# Patient Record
Sex: Female | Born: 1975 | Race: White | Hispanic: No | Marital: Married | Smoking: Never smoker
Health system: Southern US, Community
[De-identification: ages and names within clinical notes are randomized; demographics above are authoritative.]

## PROBLEM LIST (undated history)

## (undated) ENCOUNTER — Inpatient Hospital Stay (HOSPITAL_COMMUNITY): Payer: Self-pay

## (undated) DIAGNOSIS — R87619 Unspecified abnormal cytological findings in specimens from cervix uteri: Secondary | ICD-10-CM

## (undated) DIAGNOSIS — F419 Anxiety disorder, unspecified: Secondary | ICD-10-CM

## (undated) DIAGNOSIS — F32A Depression, unspecified: Secondary | ICD-10-CM

## (undated) DIAGNOSIS — IMO0002 Reserved for concepts with insufficient information to code with codable children: Secondary | ICD-10-CM

## (undated) DIAGNOSIS — F329 Major depressive disorder, single episode, unspecified: Secondary | ICD-10-CM

## (undated) HISTORY — DX: Depression, unspecified: F32.A

## (undated) HISTORY — PX: BREAST LUMPECTOMY: SHX2

## (undated) HISTORY — DX: Major depressive disorder, single episode, unspecified: F32.9

## (undated) HISTORY — DX: Anxiety disorder, unspecified: F41.9

---

## 1998-01-28 ENCOUNTER — Other Ambulatory Visit: Admission: RE | Admit: 1998-01-28 | Discharge: 1998-01-28 | Payer: Self-pay | Admitting: Obstetrics and Gynecology

## 1998-02-28 ENCOUNTER — Other Ambulatory Visit: Admission: RE | Admit: 1998-02-28 | Discharge: 1998-02-28 | Payer: Self-pay | Admitting: *Deleted

## 1998-08-08 ENCOUNTER — Other Ambulatory Visit: Admission: RE | Admit: 1998-08-08 | Discharge: 1998-08-08 | Payer: Self-pay | Admitting: *Deleted

## 1998-12-05 ENCOUNTER — Other Ambulatory Visit: Admission: RE | Admit: 1998-12-05 | Discharge: 1998-12-05 | Payer: Self-pay | Admitting: Obstetrics and Gynecology

## 1999-03-06 ENCOUNTER — Other Ambulatory Visit: Admission: RE | Admit: 1999-03-06 | Discharge: 1999-03-06 | Payer: Self-pay | Admitting: *Deleted

## 1999-09-18 ENCOUNTER — Other Ambulatory Visit: Admission: RE | Admit: 1999-09-18 | Discharge: 1999-09-18 | Payer: Self-pay | Admitting: *Deleted

## 2001-05-18 ENCOUNTER — Other Ambulatory Visit: Admission: RE | Admit: 2001-05-18 | Discharge: 2001-05-18 | Payer: Self-pay | Admitting: Obstetrics and Gynecology

## 2001-05-23 ENCOUNTER — Encounter: Payer: Self-pay | Admitting: Obstetrics and Gynecology

## 2001-05-23 ENCOUNTER — Encounter: Admission: RE | Admit: 2001-05-23 | Discharge: 2001-05-23 | Payer: Self-pay | Admitting: *Deleted

## 2001-10-13 ENCOUNTER — Other Ambulatory Visit: Admission: RE | Admit: 2001-10-13 | Discharge: 2001-10-13 | Payer: Self-pay | Admitting: Obstetrics and Gynecology

## 2002-04-14 ENCOUNTER — Ambulatory Visit (HOSPITAL_COMMUNITY): Admission: RE | Admit: 2002-04-14 | Discharge: 2002-04-14 | Payer: Self-pay | Admitting: Orthopaedic Surgery

## 2002-04-14 ENCOUNTER — Encounter: Payer: Self-pay | Admitting: Orthopaedic Surgery

## 2002-06-13 ENCOUNTER — Other Ambulatory Visit: Admission: RE | Admit: 2002-06-13 | Discharge: 2002-06-13 | Payer: Self-pay | Admitting: Obstetrics and Gynecology

## 2008-10-17 ENCOUNTER — Emergency Department: Payer: Self-pay | Admitting: Emergency Medicine

## 2009-06-18 ENCOUNTER — Emergency Department: Payer: Self-pay | Admitting: Emergency Medicine

## 2010-05-06 ENCOUNTER — Ambulatory Visit: Payer: Self-pay | Admitting: Unknown Physician Specialty

## 2010-06-08 IMAGING — CR DG CHEST 1V PORT
1 series · 1 of 1 positions shown · non-contrast
Comparison: none

REASON FOR EXAM: pain in epigastrium
COMMENTS:

PROCEDURE:     DXR - DXR PORTABLE CHEST SINGLE VIEW  - October 17, 2008 [DATE]
RESULT:     AP view of the chest shows the lung fields to be clear. The
heart, mediastinal and osseous structures show no acute changes.

[view not recorded]
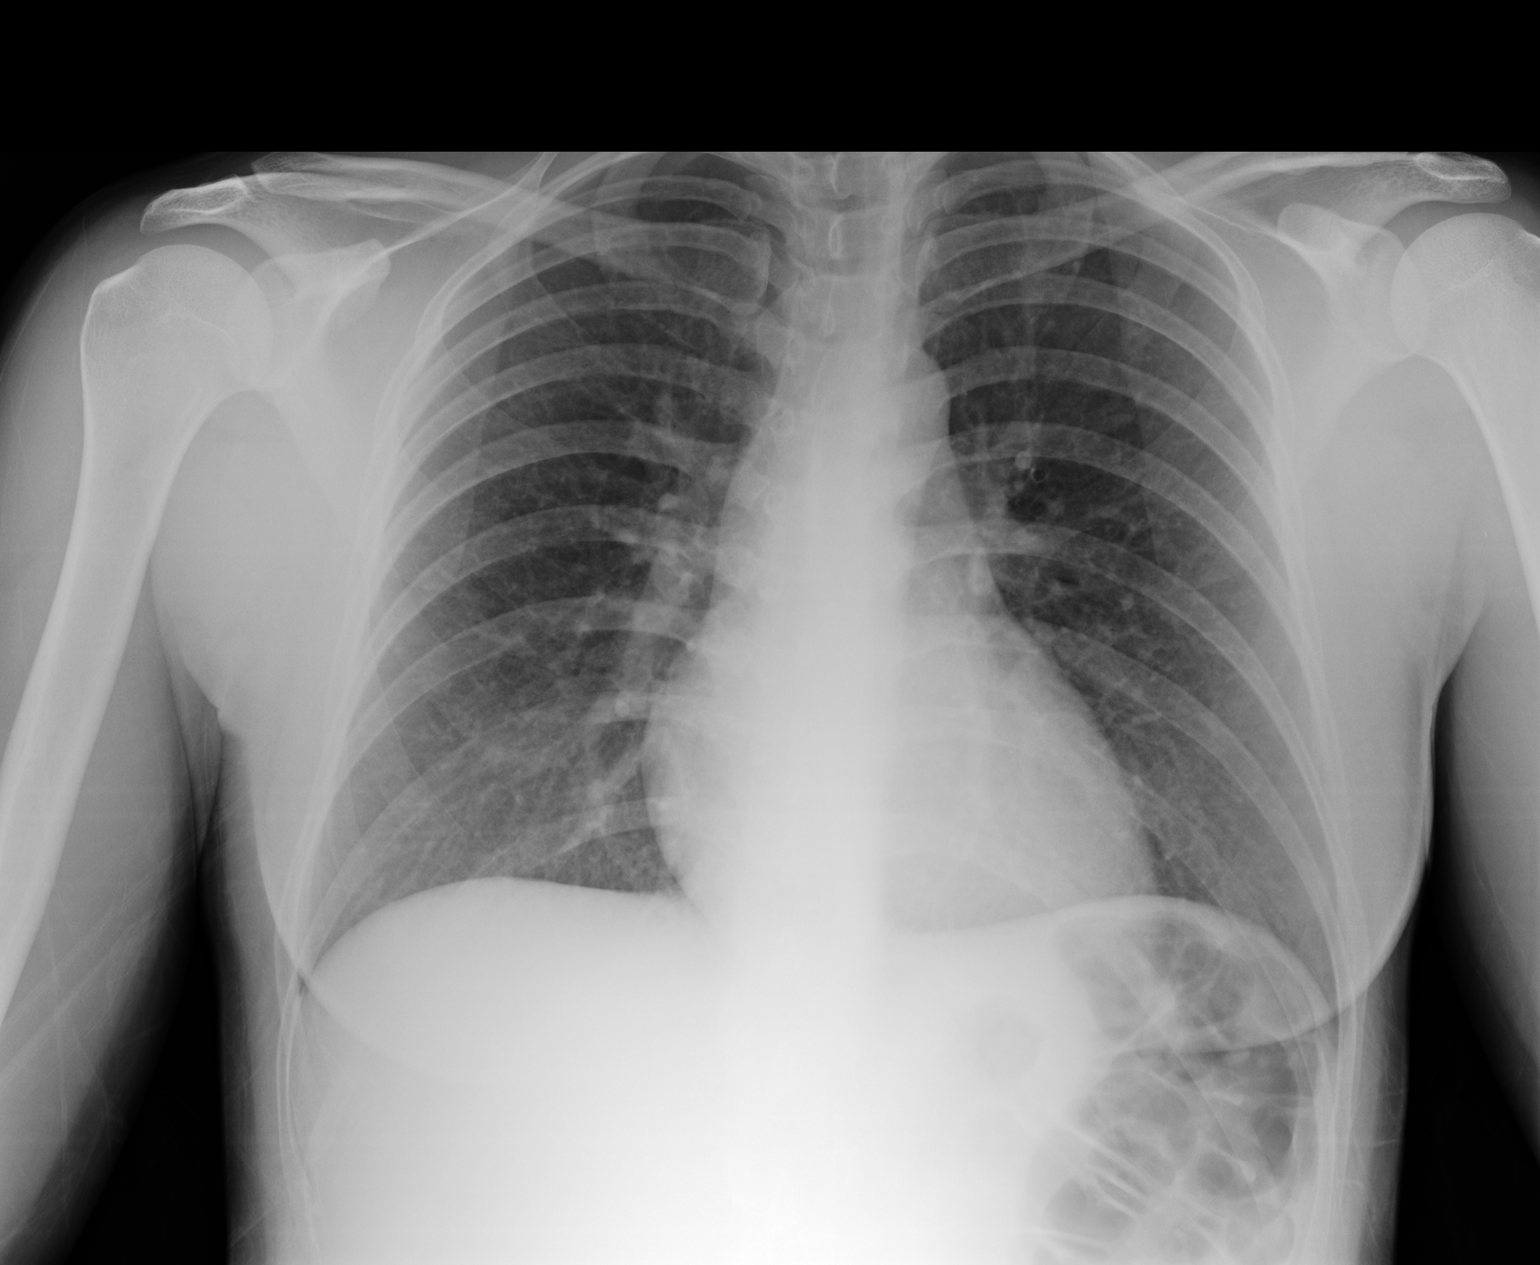

[1 of 1 positions shown; findings below may reference images not displayed]

IMPRESSION: 1.     No acute changes are identified.

## 2010-06-09 IMAGING — CT CT ABD-PELV W/ CM
1 of 2 series · 15 of 32 positions shown, 19 images · non-contrast
Comparison: none

REASON FOR EXAM: (1) abd pain; (2) abd pain
COMMENTS:

PROCEDURE:     CT  - CT ABDOMEN / PELVIS  W  - October 18, 2008  [DATE]
RESULT:
HISTORY: Nausea and epigastric pain.

[Series 2: appendicitis · axial · 0.66mm/px · z∈[+402,+798]mm · 15 of 144 slices shown, 19 images]
[im 6/144  soft-tissue]
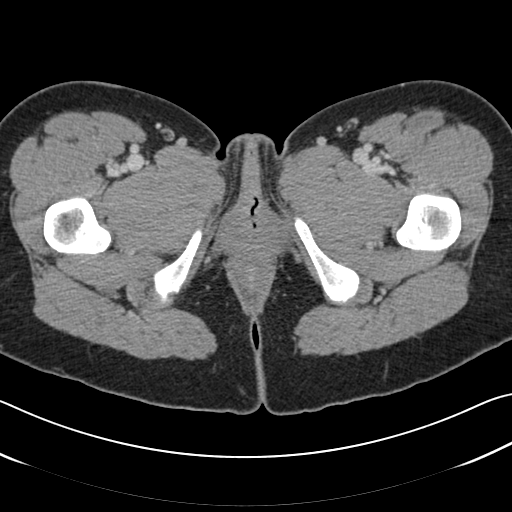
[im 6/144  bone]
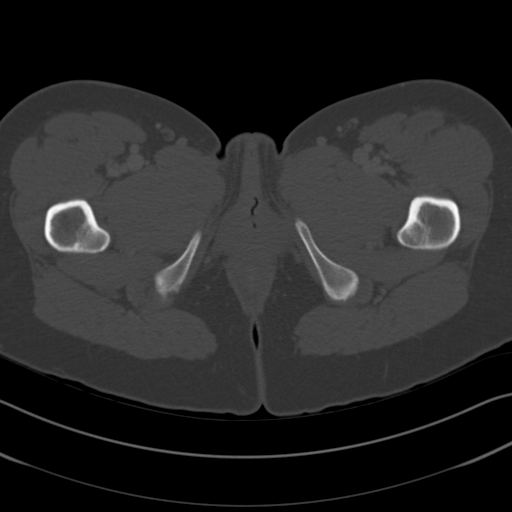
[im 18/144  soft-tissue]
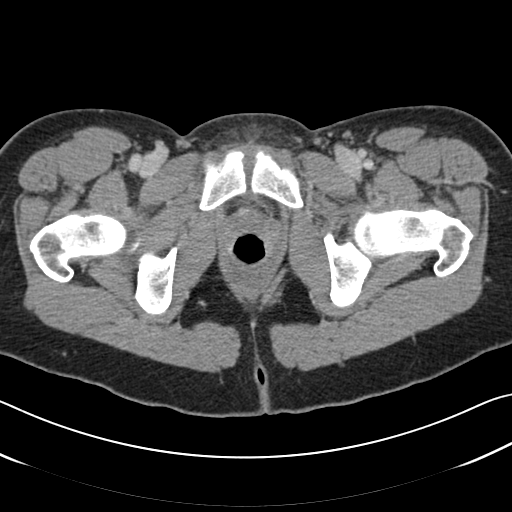
[im 30/144  soft-tissue]
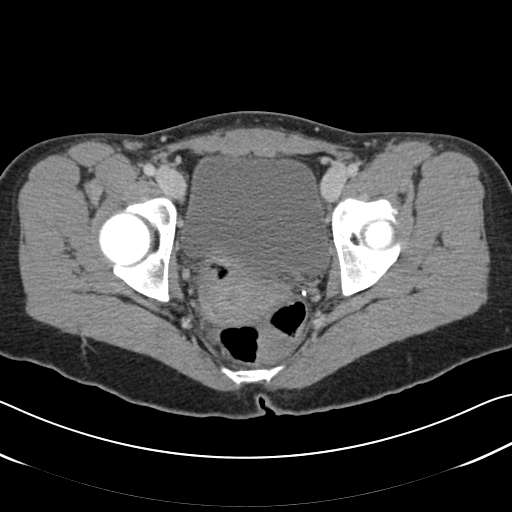
[im 42/144  soft-tissue]
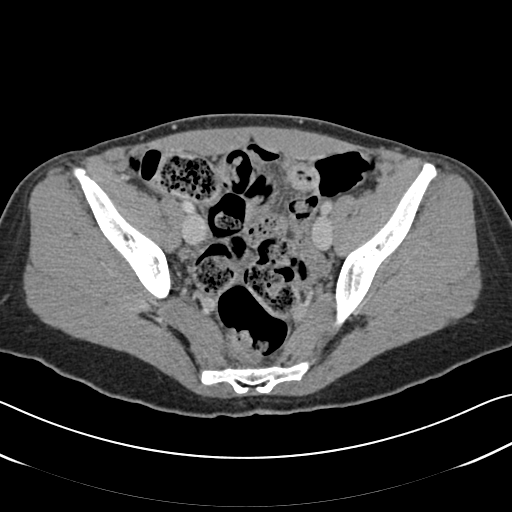
[im 48/144  soft-tissue]
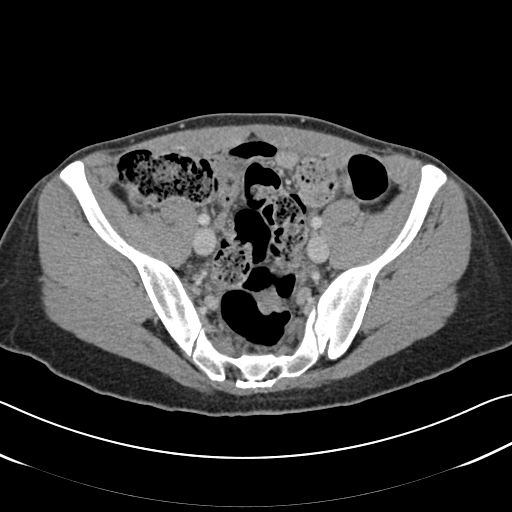
[im 60/144  soft-tissue]
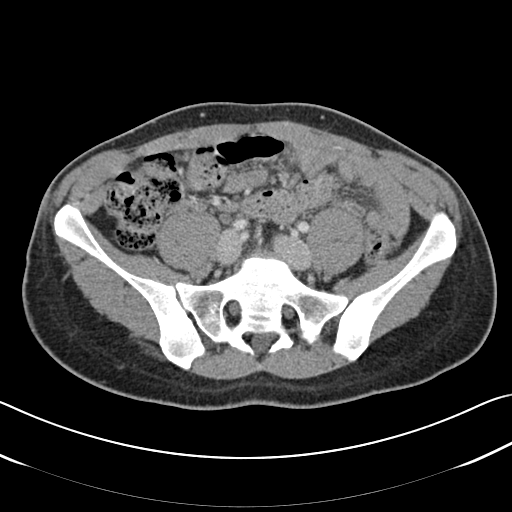
[im 72/144  soft-tissue]
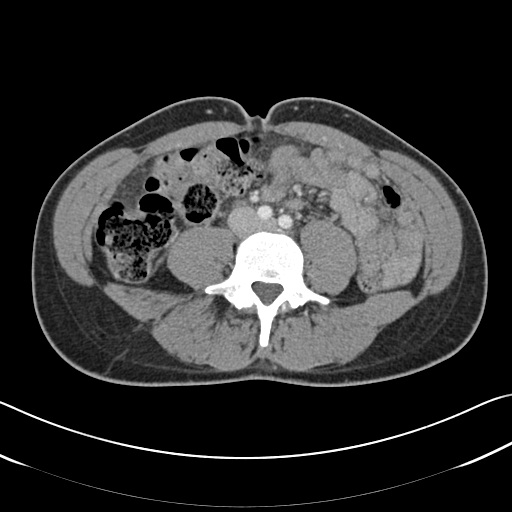
[im 84/144  soft-tissue]
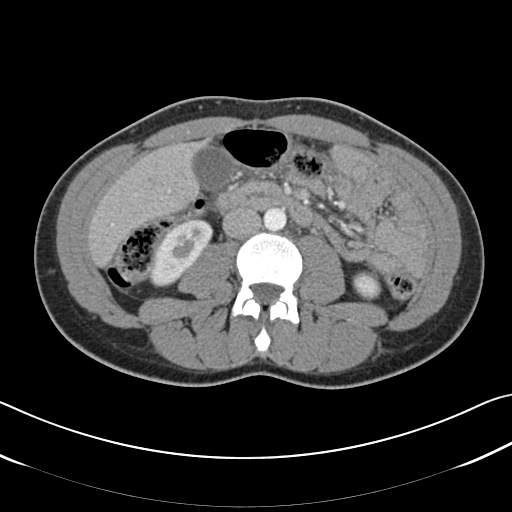
[im 96/144  soft-tissue]
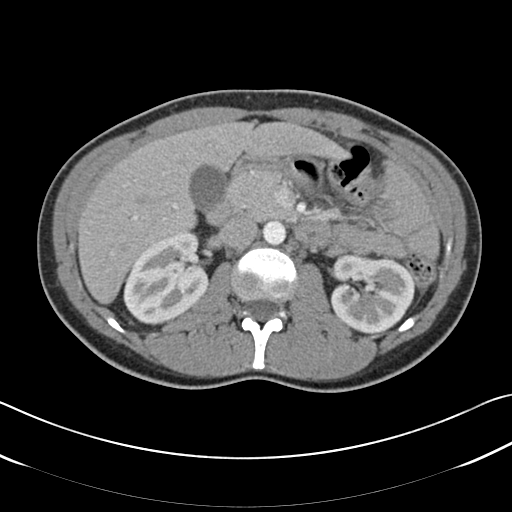
[im 96/144  bone]
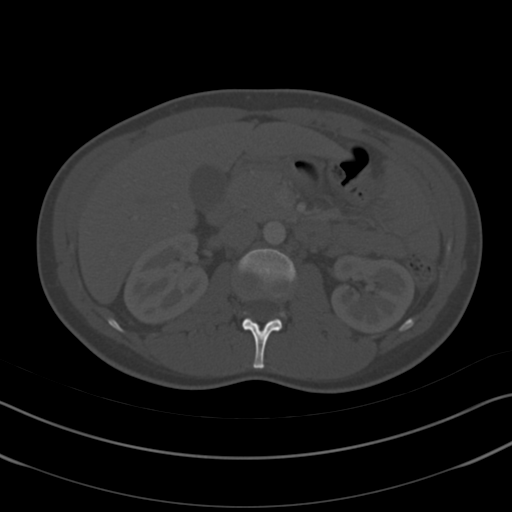
[im 102/144  soft-tissue]
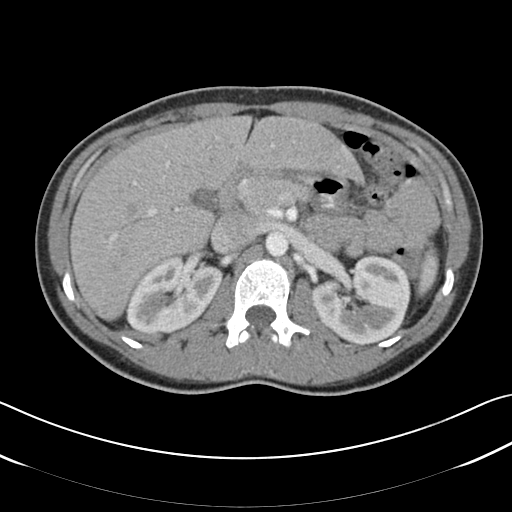
[im 114/144  soft-tissue]
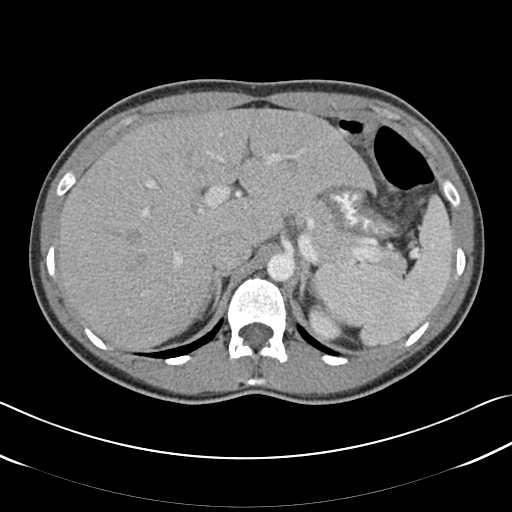
[im 120/144  lung]
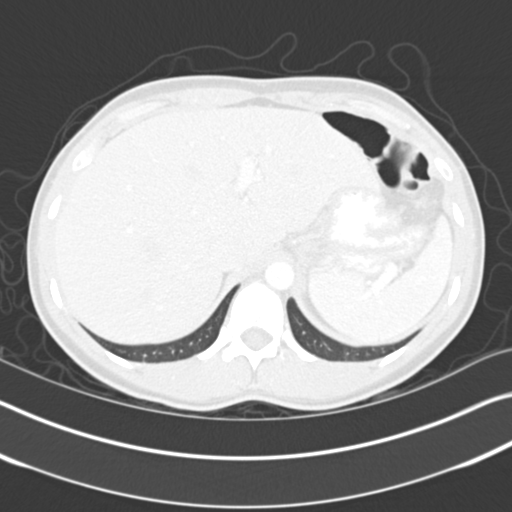
[im 126/144  soft-tissue]
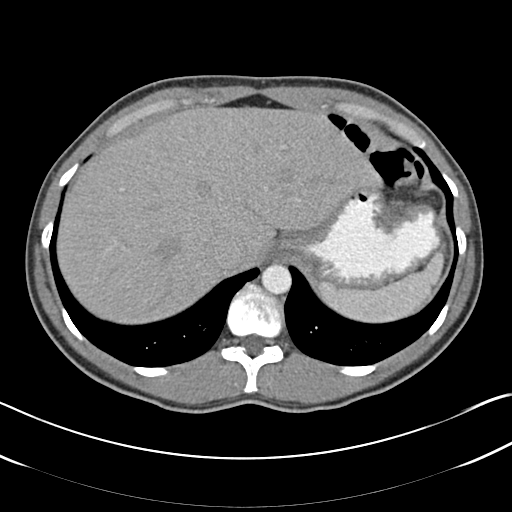
[im 126/144  lung]
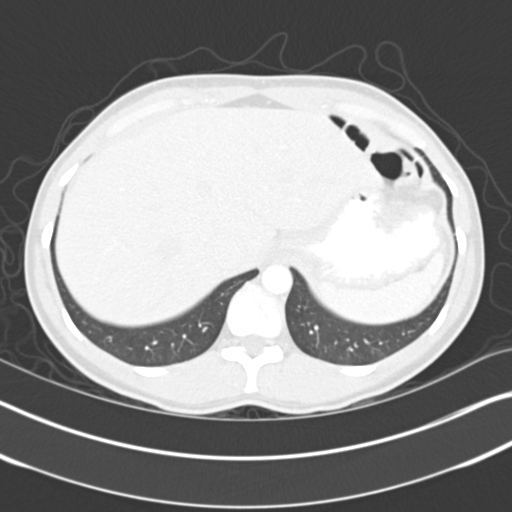
[im 132/144  lung]
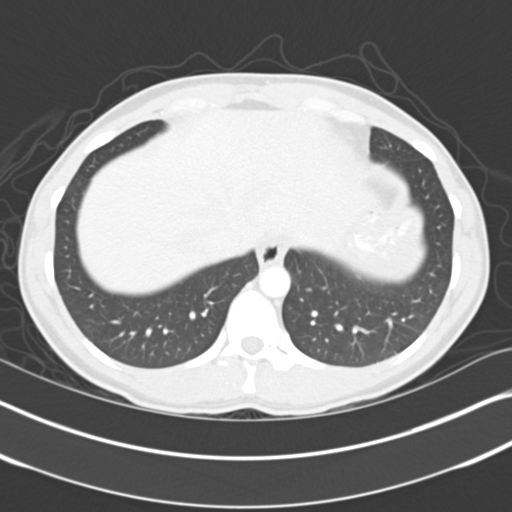
[im 138/144  soft-tissue]
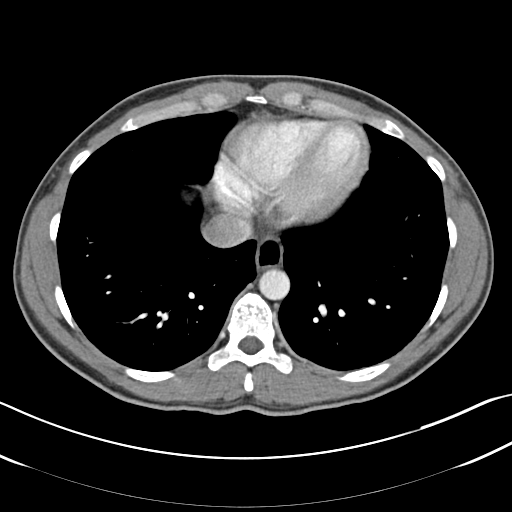
[im 138/144  lung]
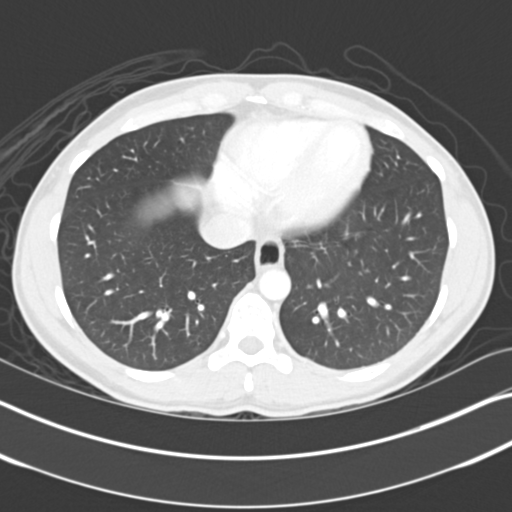

[15 of 32 positions shown; findings below may reference images not displayed]

FINDINGS: IV contrast enhanced CT of the abdomen and pelvis was obtained.
The liver is normal. The gallbladder is nondistended. There is no biliary
distention. The spleen and pancreas are normal. Adrenals are normal. Tiny
renal cysts are present. No hydronephrosis. No bowel distention noted.
Appendix is normal. Shotty mesenteric lymph nodes cannot be excluded. Small
amount of free of fluid is noted in the pelvis. Stool is noted throughout
the colon. There is no bowel distention or free air. Abdominal aorta is
unremarkable. Lung bases are clear.
IMPRESSION: 1.     Small amount of free fluid in the pelvis.
2.     Small mesenteric lymph nodes.
3.     Not mentioned above is mild gastric fold thickness. Clinical
correlation is suggested. Mild changes of gastritis could present in this
fashion as could incomplete distention of the stomach.

## 2010-06-25 LAB — HM PAP SMEAR

## 2010-07-19 ENCOUNTER — Emergency Department: Payer: Self-pay | Admitting: Emergency Medicine

## 2011-03-04 LAB — HM PAP SMEAR: HM Pap smear: NORMAL

## 2011-04-09 ENCOUNTER — Other Ambulatory Visit: Payer: Self-pay | Admitting: Oncology

## 2011-04-09 DIAGNOSIS — C50919 Malignant neoplasm of unspecified site of unspecified female breast: Secondary | ICD-10-CM

## 2011-04-11 ENCOUNTER — Other Ambulatory Visit: Payer: Self-pay | Admitting: Oncology

## 2011-05-07 ENCOUNTER — Other Ambulatory Visit: Payer: Self-pay | Admitting: *Deleted

## 2011-05-07 DIAGNOSIS — C50919 Malignant neoplasm of unspecified site of unspecified female breast: Secondary | ICD-10-CM

## 2011-05-07 MED ORDER — HEPARIN SOD (PORK) LOCK FLUSH 100 UNIT/ML IV SOLN
500.0000 [IU] | Freq: Once | INTRAVENOUS | Status: DC
  Filled 2011-05-07: qty 5

## 2011-05-07 MED ORDER — SODIUM CHLORIDE 0.9 % IJ SOLN
10.0000 mL | INTRAMUSCULAR | Status: DC | PRN
  Filled 2011-05-07: qty 10

## 2011-05-12 ENCOUNTER — Other Ambulatory Visit: Payer: Self-pay

## 2011-05-12 DIAGNOSIS — C50919 Malignant neoplasm of unspecified site of unspecified female breast: Secondary | ICD-10-CM

## 2011-05-12 MED ORDER — PANTOPRAZOLE SODIUM 40 MG PO TBEC: 40.0000 mg | DELAYED_RELEASE_TABLET | Freq: Every day | ORAL | Status: DC

## 2011-08-21 LAB — BASIC METABOLIC PANEL: BUN: 15 (ref 4–21)

## 2011-08-22 LAB — CBC AND DIFFERENTIAL
HCT: 38 (ref 36–46)
Hemoglobin: 12.6 (ref 12.0–16.0)
Platelets: 339 (ref 150–399)
WBC: 6.4

## 2011-08-22 LAB — BASIC METABOLIC PANEL
Chloride: 102 (ref 99–108)
Creatinine: 0.7 (ref <=1.1)
Glucose: 87
Potassium: 4 (ref 3.4–5.3)
Sodium: 138 (ref 137–147)

## 2011-08-22 LAB — LIPID PANEL
Cholesterol: 141 (ref 0–200)
HDL: 43 (ref 35–70)
LDL Cholesterol: 92
Triglycerides: 32 — AB (ref 40–160)

## 2011-08-22 LAB — HEPATIC FUNCTION PANEL
ALT: 17 (ref 7–35)
AST: 23 (ref 13–35)
Alkaline Phosphatase: 49 (ref 25–125)

## 2011-08-22 LAB — TSH: TSH: 1.4 (ref <=5.90)

## 2011-08-22 LAB — COMPREHENSIVE METABOLIC PANEL: Calcium: 9.1 (ref 8.7–10.7)

## 2012-04-12 ENCOUNTER — Inpatient Hospital Stay (HOSPITAL_COMMUNITY)
Admission: AD | Admit: 2012-04-12 | Discharge: 2012-04-16 | DRG: 371 | Disposition: A | Discharge status: Home / Self Care | Payer: BC Managed Care – PPO | Source: Ambulatory Visit | Attending: Obstetrics and Gynecology | Admitting: Obstetrics and Gynecology

## 2012-04-12 ENCOUNTER — Inpatient Hospital Stay (HOSPITAL_COMMUNITY): Payer: BC Managed Care – PPO

## 2012-04-12 ENCOUNTER — Encounter (HOSPITAL_COMMUNITY): Payer: Self-pay

## 2012-04-12 DIAGNOSIS — O4290 Premature rupture of membranes, unspecified as to length of time between rupture and onset of labor, unspecified weeks of gestation: Secondary | ICD-10-CM | POA: Diagnosis present

## 2012-04-12 DIAGNOSIS — O99892 Other specified diseases and conditions complicating childbirth: Secondary | ICD-10-CM | POA: Diagnosis present

## 2012-04-12 DIAGNOSIS — N9089 Other specified noninflammatory disorders of vulva and perineum: Secondary | ICD-10-CM | POA: Diagnosis present

## 2012-04-12 HISTORY — DX: Unspecified abnormal cytological findings in specimens from cervix uteri: R87.619

## 2012-04-12 HISTORY — DX: Reserved for concepts with insufficient information to code with codable children: IMO0002

## 2012-04-12 LAB — CBC
MCH: 32.8 pg (ref 26.0–34.0)
MCV: 95.5 fL (ref 78.0–100.0)
Platelets: 291 10*3/uL (ref 150–400)
RBC: 4 MIL/uL (ref 3.87–5.11)

## 2012-04-12 LAB — AMNISURE RUPTURE OF MEMBRANE (ROM) NOT AT ARMC: Amnisure ROM: POSITIVE

## 2012-04-12 LAB — OB RESULTS CONSOLE RUBELLA ANTIBODY, IGM: Rubella: UNDETERMINED

## 2012-04-12 LAB — OB RESULTS CONSOLE RPR: RPR: NONREACTIVE

## 2012-04-12 LAB — OB RESULTS CONSOLE ABO/RH: RH Type: NEGATIVE

## 2012-04-12 LAB — OB RESULTS CONSOLE HIV ANTIBODY (ROUTINE TESTING): HIV: NONREACTIVE

## 2012-04-12 MED ORDER — BUTORPHANOL TARTRATE 1 MG/ML IJ SOLN: 1.0000 mg | INTRAMUSCULAR | Status: DC | PRN

## 2012-04-12 MED ORDER — EPHEDRINE 5 MG/ML INJ: 10.0000 mg | INTRAVENOUS | Status: DC | PRN

## 2012-04-12 MED ORDER — FENTANYL 2.5 MCG/ML BUPIVACAINE 1/10 % EPIDURAL INFUSION (WH - ANES)
14.0000 mL/h | INTRAMUSCULAR | Status: DC
  Administered 2012-04-13: 14 mL/h via EPIDURAL
  Filled 2012-04-12 (×2): qty 125

## 2012-04-12 MED ORDER — OXYTOCIN 40 UNITS IN LACTATED RINGERS INFUSION - SIMPLE MED: 62.5000 mL/h | INTRAVENOUS | Status: DC

## 2012-04-12 MED ORDER — LIDOCAINE HCL (PF) 1 % IJ SOLN: 30.0000 mL | INTRAMUSCULAR | Status: DC | PRN

## 2012-04-12 MED ORDER — DIPHENHYDRAMINE HCL 50 MG/ML IJ SOLN: 12.5000 mg | INTRAMUSCULAR | Status: DC | PRN

## 2012-04-12 MED ORDER — SODIUM CHLORIDE 0.9 % IV SOLN
2.0000 g | Freq: Once | INTRAVENOUS | Status: AC
  Administered 2012-04-12: 2 g via INTRAVENOUS
  Filled 2012-04-12: qty 2000

## 2012-04-12 MED ORDER — OXYTOCIN BOLUS FROM INFUSION: 500.0000 mL | INTRAVENOUS | Status: DC

## 2012-04-12 MED ORDER — CITRIC ACID-SODIUM CITRATE 334-500 MG/5ML PO SOLN
30.0000 mL | ORAL | Status: DC | PRN
  Administered 2012-04-13: 30 mL via ORAL
  Filled 2012-04-12: qty 15

## 2012-04-12 MED ORDER — SODIUM CHLORIDE 0.9 % IV SOLN
1.0000 g | Freq: Four times a day (QID) | INTRAVENOUS | Status: DC
  Administered 2012-04-12 – 2012-04-13 (×3): 1 g via INTRAVENOUS
  Filled 2012-04-12 (×6): qty 1000

## 2012-04-12 MED ORDER — MISOPROSTOL 25 MCG QUARTER TABLET
25.0000 ug | ORAL_TABLET | ORAL | Status: DC | PRN
  Administered 2012-04-12 (×2): 25 ug via VAGINAL
  Filled 2012-04-12: qty 0.25
  Filled 2012-04-12: qty 1
  Filled 2012-04-12: qty 0.25

## 2012-04-12 MED ORDER — LACTATED RINGERS IV SOLN
INTRAVENOUS | Status: DC
  Administered 2012-04-12 – 2012-04-13 (×4): via INTRAVENOUS

## 2012-04-12 MED ORDER — PHENYLEPHRINE 40 MCG/ML (10ML) SYRINGE FOR IV PUSH (FOR BLOOD PRESSURE SUPPORT)
80.0000 ug | PREFILLED_SYRINGE | INTRAVENOUS | Status: DC | PRN
Start: 2012-04-12 — End: 2012-04-13
  Filled 2012-04-12: qty 5

## 2012-04-12 MED ORDER — LACTATED RINGERS IV SOLN: 500.0000 mL | INTRAVENOUS | Status: DC | PRN

## 2012-04-12 MED ORDER — TERBUTALINE SULFATE 1 MG/ML IJ SOLN: 0.2500 mg | Freq: Once | INTRAMUSCULAR | Status: AC | PRN

## 2012-04-12 MED ORDER — IBUPROFEN 600 MG PO TABS: 600.0000 mg | ORAL_TABLET | Freq: Four times a day (QID) | ORAL | Status: DC | PRN

## 2012-04-12 MED ORDER — CLINDAMYCIN PHOSPHATE 900 MG/50ML IV SOLN: 900.0000 mg | Freq: Once | INTRAVENOUS | Status: DC

## 2012-04-12 MED ORDER — ONDANSETRON HCL 4 MG/2ML IJ SOLN: 4.0000 mg | Freq: Four times a day (QID) | INTRAMUSCULAR | Status: DC | PRN

## 2012-04-12 MED ORDER — PHENYLEPHRINE 40 MCG/ML (10ML) SYRINGE FOR IV PUSH (FOR BLOOD PRESSURE SUPPORT)
80.0000 ug | PREFILLED_SYRINGE | INTRAVENOUS | Status: DC | PRN
Start: 2012-04-12 — End: 2012-04-13

## 2012-04-12 MED ORDER — ACETAMINOPHEN 325 MG PO TABS: 650.0000 mg | ORAL_TABLET | ORAL | Status: DC | PRN

## 2012-04-12 MED ORDER — LACTATED RINGERS IV SOLN: 500.0000 mL | Freq: Once | INTRAVENOUS | Status: DC

## 2012-04-12 MED ORDER — ESCITALOPRAM OXALATE 10 MG PO TABS
10.0000 mg | ORAL_TABLET | Freq: Every day | ORAL | Status: DC
  Administered 2012-04-13: 10 mg via ORAL
  Filled 2012-04-12 (×2): qty 1

## 2012-04-12 MED ORDER — EPHEDRINE 5 MG/ML INJ
10.0000 mg | INTRAVENOUS | Status: DC | PRN
Start: 2012-04-12 — End: 2012-04-13
  Filled 2012-04-12: qty 4

## 2012-04-12 MED ORDER — SODIUM CHLORIDE 0.9 % IV SOLN: 2.0000 g | Freq: Once | INTRAVENOUS | Status: DC

## 2012-04-12 NOTE — Progress Notes (Signed)
S:  Pt is a G1P0 here at 38.5 wks IUP with report of questionable leaking of fluid last night.  Noticed a clear fluid from vagina. Has not continued since primary occurrence.  No report of contractions or vaginal bleeding. O: Filed Vitals:   04/12/12 0949  BP: 128/77  Pulse: 79  Temp:   Resp: 16  Dilation: Closed Exam by:: W Muhammad CNM  Pelvic - small amount of clear fluid seen from os, mixed with dark brown discharge.  Fern - negative  Jack Quarto, RN will notify MD for plan of care.  [facility]

## 2012-04-12 NOTE — MAU Note (Signed)
Pt states at 0100 she felt lof in bed, no large gush of fluid, has had intermittent leaking though not a large amount. Noted blood tinged mucus like d/c as well.

## 2012-04-12 NOTE — MAU Note (Signed)
C/o loweer back ache & ? SROM this AM around 0100;

## 2012-04-12 NOTE — Plan of Care (Signed)
Problem: Consults Goal: Birthing Suites Patient Information Press F2 to bring up selections list   Pt 37-[redacted] weeks EGA     

## 2012-04-12 NOTE — H&P (Signed)
Heather Powell is a 36 y.o. female presenting for C/O SROM at about 1 am today.  Intermittent squirts of fluid, no gush.  Some pink staining to fluid, no green.  HA earlier today, none now. No vision change or epigastric pain. In MAU amniosure is positive. Still C/O occasional small leaks. Maternal Medical History:  Reason for admission: Reason for admission: rupture of membranes.  Fetal activity: Perceived fetal activity is normal.      OB History    Grav Para Term Preterm Abortions TAB SAB Ect Mult Living   1 0 0 0 0 0 0 0 0 0      Past Medical History  Diagnosis Date  . No pertinent past medical history    Past Surgical History  Procedure Date  . Breast lumpectomy    Family History: family history is negative for Other. Social History:  reports that she has never smoked. She does not have any smokeless tobacco history on file. She reports that she does not drink alcohol or use illicit drugs.   Prenatal Transfer Tool  Maternal Diabetes: No Genetic Screening: Normal Maternal Ultrasounds/Referrals: Normal Fetal Ultrasounds or other Referrals:  None Maternal Substance Abuse:  No Significant Maternal Medications:  None Significant Maternal Lab Results:  None Other Comments:  None  Review of Systems  Constitutional: Negative for fever.  Eyes: Negative for blurred vision.  Gastrointestinal: Negative for abdominal pain.  Neurological: Negative for headaches.    Dilation: Closed Exam by:: Heather Powell CNM Blood pressure 132/76, pulse 82, temperature 98.1 F (36.7 C), temperature source Oral, resp. rate 16, height 5\' 5"  (1.651 m), weight 197 lb 4 oz (89.472 kg). Maternal Exam:  Uterine Assessment: Contraction strength is mild.  Contraction frequency is irregular.   Abdomen: Fetal presentation: vertex     Fetal Exam Fetal Monitor Review: Pattern: accelerations present.       Physical Exam  GI: Soft. There is no tenderness.  Neurological: She has normal reflexes.   beside U/S vtx cx cl/th/post/mod soft Prenatal labs: ABO, Rh:   Antibody:   Rubella:   RPR:    HBsAg:    HIV:    GBS:     Assessment/Plan: 36 yo G1Po at 61 5/7 wks with SROM and unfavorable cervix. D/Heather Dr Heather Powell, MFM-will begin 2 stage induction. D/Heather patient and husband above and risks of induction including fetal distress , emergent cesarean section, failed induction.  Heather Powell II,Heather Powell E 04/12/2012, 1:13 PM

## 2012-04-13 ENCOUNTER — Inpatient Hospital Stay (HOSPITAL_COMMUNITY): Payer: BC Managed Care – PPO | Admitting: Anesthesiology

## 2012-04-13 ENCOUNTER — Encounter (HOSPITAL_COMMUNITY)
Admission: AD | Disposition: A | Discharge status: Home / Self Care | Payer: Self-pay | Source: Ambulatory Visit | Attending: Obstetrics and Gynecology

## 2012-04-13 ENCOUNTER — Encounter (HOSPITAL_COMMUNITY): Payer: Self-pay | Admitting: Anesthesiology

## 2012-04-13 ENCOUNTER — Encounter (HOSPITAL_COMMUNITY): Payer: Self-pay | Admitting: *Deleted

## 2012-04-13 SURGERY — Surgical Case
Anesthesia: Epidural | Site: Abdomen | Wound class: Clean Contaminated

## 2012-04-13 MED ORDER — SCOPOLAMINE 1 MG/3DAYS TD PT72
MEDICATED_PATCH | TRANSDERMAL | Status: AC
  Administered 2012-04-13: 1.5 mg via TRANSDERMAL
  Filled 2012-04-13: qty 1

## 2012-04-13 MED ORDER — LACTATED RINGERS IV SOLN
INTRAVENOUS | Status: DC | PRN
  Administered 2012-04-13 (×3): via INTRAVENOUS

## 2012-04-13 MED ORDER — FENTANYL CITRATE 0.05 MG/ML IJ SOLN: 25.0000 ug | INTRAMUSCULAR | Status: DC | PRN

## 2012-04-13 MED ORDER — FENTANYL 2.5 MCG/ML BUPIVACAINE 1/10 % EPIDURAL INFUSION (WH - ANES)
INTRAMUSCULAR | Status: DC | PRN
  Administered 2012-04-13: 14 mL/h via EPIDURAL

## 2012-04-13 MED ORDER — METOCLOPRAMIDE HCL 5 MG/ML IJ SOLN
INTRAMUSCULAR | Status: AC
  Administered 2012-04-13: 10 mg via INTRAVENOUS
  Filled 2012-04-13: qty 2

## 2012-04-13 MED ORDER — ONDANSETRON HCL 4 MG/2ML IJ SOLN
INTRAMUSCULAR | Status: AC
  Filled 2012-04-13: qty 2

## 2012-04-13 MED ORDER — SIMETHICONE 80 MG PO CHEW: 80.0000 mg | CHEWABLE_TABLET | ORAL | Status: DC | PRN

## 2012-04-13 MED ORDER — SODIUM CHLORIDE 0.9 % IJ SOLN: 3.0000 mL | INTRAMUSCULAR | Status: DC | PRN

## 2012-04-13 MED ORDER — OXYTOCIN 10 UNIT/ML IJ SOLN
40.0000 [IU] | INTRAVENOUS | Status: DC | PRN
  Administered 2012-04-13: 40 [IU] via INTRAVENOUS

## 2012-04-13 MED ORDER — OXYTOCIN 10 UNIT/ML IJ SOLN
INTRAMUSCULAR | Status: AC
  Filled 2012-04-13: qty 3

## 2012-04-13 MED ORDER — KETOROLAC TROMETHAMINE 30 MG/ML IJ SOLN
30.0000 mg | Freq: Four times a day (QID) | INTRAMUSCULAR | Status: DC | PRN
  Administered 2012-04-13: 30 mg via INTRAVENOUS

## 2012-04-13 MED ORDER — FENTANYL CITRATE 0.05 MG/ML IJ SOLN
INTRAMUSCULAR | Status: AC
  Filled 2012-04-13: qty 2

## 2012-04-13 MED ORDER — OXYTOCIN 40 UNITS IN LACTATED RINGERS INFUSION - SIMPLE MED: 1.0000 m[IU]/min | INTRAVENOUS | Status: DC

## 2012-04-13 MED ORDER — EPHEDRINE 5 MG/ML INJ
INTRAVENOUS | Status: AC
  Filled 2012-04-13: qty 10

## 2012-04-13 MED ORDER — OXYTOCIN 40 UNITS IN LACTATED RINGERS INFUSION - SIMPLE MED
1.0000 m[IU]/min | INTRAVENOUS | Status: DC
  Administered 2012-04-13: 1 m[IU]/min via INTRAVENOUS
  Filled 2012-04-13: qty 1000

## 2012-04-13 MED ORDER — ACETAMINOPHEN 10 MG/ML IV SOLN: 1000.0000 mg | Freq: Four times a day (QID) | INTRAVENOUS | Status: AC | PRN

## 2012-04-13 MED ORDER — WITCH HAZEL-GLYCERIN EX PADS: 1.0000 "application " | MEDICATED_PAD | CUTANEOUS | Status: DC | PRN

## 2012-04-13 MED ORDER — TETANUS-DIPHTH-ACELL PERTUSSIS 5-2.5-18.5 LF-MCG/0.5 IM SUSP: 0.5000 mL | Freq: Once | INTRAMUSCULAR | Status: DC

## 2012-04-13 MED ORDER — KETOROLAC TROMETHAMINE 30 MG/ML IJ SOLN
INTRAMUSCULAR | Status: AC
  Filled 2012-04-13: qty 1

## 2012-04-13 MED ORDER — CEFAZOLIN SODIUM-DEXTROSE 2-3 GM-% IV SOLR
INTRAVENOUS | Status: DC | PRN
  Administered 2012-04-13: 2 g via INTRAVENOUS

## 2012-04-13 MED ORDER — LANOLIN HYDROUS EX OINT: 1.0000 "application " | TOPICAL_OINTMENT | CUTANEOUS | Status: DC | PRN

## 2012-04-13 MED ORDER — PHENYLEPHRINE HCL 10 MG/ML IJ SOLN
INTRAMUSCULAR | Status: DC | PRN
  Administered 2012-04-13: 120 ug via INTRAVENOUS
  Administered 2012-04-13: 80 ug via INTRAVENOUS
  Administered 2012-04-13: 120 ug via INTRAVENOUS
  Administered 2012-04-13: 80 ug via INTRAVENOUS

## 2012-04-13 MED ORDER — OXYCODONE-ACETAMINOPHEN 5-325 MG PO TABS: 1.0000 | ORAL_TABLET | ORAL | Status: DC | PRN

## 2012-04-13 MED ORDER — SENNOSIDES-DOCUSATE SODIUM 8.6-50 MG PO TABS
2.0000 | ORAL_TABLET | Freq: Every day | ORAL | Status: DC
  Administered 2012-04-13 – 2012-04-15 (×3): 2 via ORAL

## 2012-04-13 MED ORDER — CEFAZOLIN SODIUM-DEXTROSE 2-3 GM-% IV SOLR
INTRAVENOUS | Status: AC
  Filled 2012-04-13: qty 50

## 2012-04-13 MED ORDER — NALBUPHINE HCL 10 MG/ML IJ SOLN: 5.0000 mg | INTRAMUSCULAR | Status: DC | PRN

## 2012-04-13 MED ORDER — MEPERIDINE HCL 50 MG PO TABS
50.0000 mg | ORAL_TABLET | ORAL | Status: DC | PRN
  Administered 2012-04-13 – 2012-04-16 (×7): 50 mg via ORAL
  Filled 2012-04-13 (×8): qty 1

## 2012-04-13 MED ORDER — MORPHINE SULFATE 0.5 MG/ML IJ SOLN
INTRAMUSCULAR | Status: AC
  Filled 2012-04-13: qty 10

## 2012-04-13 MED ORDER — NALOXONE HCL 0.4 MG/ML IJ SOLN: 0.4000 mg | INTRAMUSCULAR | Status: DC | PRN

## 2012-04-13 MED ORDER — PHENYLEPHRINE 40 MCG/ML (10ML) SYRINGE FOR IV PUSH (FOR BLOOD PRESSURE SUPPORT)
PREFILLED_SYRINGE | INTRAVENOUS | Status: AC
  Filled 2012-04-13: qty 5

## 2012-04-13 MED ORDER — IBUPROFEN 600 MG PO TABS
600.0000 mg | ORAL_TABLET | Freq: Four times a day (QID) | ORAL | Status: DC
  Administered 2012-04-13 – 2012-04-16 (×10): 600 mg via ORAL
  Filled 2012-04-13 (×11): qty 1

## 2012-04-13 MED ORDER — PHENYLEPHRINE 40 MCG/ML (10ML) SYRINGE FOR IV PUSH (FOR BLOOD PRESSURE SUPPORT)
PREFILLED_SYRINGE | INTRAVENOUS | Status: AC
  Filled 2012-04-13: qty 10

## 2012-04-13 MED ORDER — SCOPOLAMINE 1 MG/3DAYS TD PT72
1.0000 | MEDICATED_PATCH | Freq: Once | TRANSDERMAL | Status: AC
  Administered 2012-04-13: 1.5 mg via TRANSDERMAL

## 2012-04-13 MED ORDER — DIPHENHYDRAMINE HCL 50 MG/ML IJ SOLN: 25.0000 mg | INTRAMUSCULAR | Status: DC | PRN

## 2012-04-13 MED ORDER — SODIUM CHLORIDE 0.9 % IV SOLN: 1.0000 ug/kg/h | INTRAVENOUS | Status: DC | PRN

## 2012-04-13 MED ORDER — ONDANSETRON HCL 4 MG PO TABS
4.0000 mg | ORAL_TABLET | ORAL | Status: DC | PRN
  Administered 2012-04-13: 4 mg via ORAL
  Filled 2012-04-13: qty 1

## 2012-04-13 MED ORDER — MENTHOL 3 MG MT LOZG: 1.0000 | LOZENGE | OROMUCOSAL | Status: DC | PRN

## 2012-04-13 MED ORDER — PROMETHAZINE HCL 25 MG/ML IJ SOLN: 6.2500 mg | INTRAMUSCULAR | Status: DC | PRN

## 2012-04-13 MED ORDER — DIPHENHYDRAMINE HCL 50 MG/ML IJ SOLN: 12.5000 mg | INTRAMUSCULAR | Status: DC | PRN

## 2012-04-13 MED ORDER — SIMETHICONE 80 MG PO CHEW
80.0000 mg | CHEWABLE_TABLET | Freq: Three times a day (TID) | ORAL | Status: DC
  Administered 2012-04-13 – 2012-04-16 (×9): 80 mg via ORAL

## 2012-04-13 MED ORDER — DIPHENHYDRAMINE HCL 25 MG PO CAPS: 25.0000 mg | ORAL_CAPSULE | Freq: Four times a day (QID) | ORAL | Status: DC | PRN

## 2012-04-13 MED ORDER — SODIUM BICARBONATE 8.4 % IV SOLN
INTRAVENOUS | Status: DC | PRN
  Administered 2012-04-13: 5 mL via EPIDURAL

## 2012-04-13 MED ORDER — TERBUTALINE SULFATE 1 MG/ML IJ SOLN: 0.2500 mg | Freq: Once | INTRAMUSCULAR | Status: DC | PRN

## 2012-04-13 MED ORDER — MEPERIDINE HCL 25 MG/ML IJ SOLN: 6.2500 mg | INTRAMUSCULAR | Status: DC | PRN

## 2012-04-13 MED ORDER — DIBUCAINE 1 % RE OINT: 1.0000 "application " | TOPICAL_OINTMENT | RECTAL | Status: DC | PRN

## 2012-04-13 MED ORDER — LACTATED RINGERS IV SOLN
INTRAVENOUS | Status: DC
  Administered 2012-04-14: 02:00:00 via INTRAVENOUS

## 2012-04-13 MED ORDER — BUPIVACAINE HCL (PF) 0.25 % IJ SOLN
INTRAMUSCULAR | Status: AC
  Filled 2012-04-13: qty 30

## 2012-04-13 MED ORDER — ONDANSETRON HCL 4 MG/2ML IJ SOLN: 4.0000 mg | Freq: Three times a day (TID) | INTRAMUSCULAR | Status: DC | PRN

## 2012-04-13 MED ORDER — PRENATAL MULTIVITAMIN CH
1.0000 | ORAL_TABLET | Freq: Every day | ORAL | Status: DC
  Administered 2012-04-14 – 2012-04-16 (×3): 1 via ORAL
  Filled 2012-04-13 (×3): qty 1

## 2012-04-13 MED ORDER — ZOLPIDEM TARTRATE 5 MG PO TABS: 5.0000 mg | ORAL_TABLET | Freq: Every evening | ORAL | Status: DC | PRN

## 2012-04-13 MED ORDER — OXYTOCIN 40 UNITS IN LACTATED RINGERS INFUSION - SIMPLE MED: 62.5000 mL/h | INTRAVENOUS | Status: DC

## 2012-04-13 MED ORDER — ONDANSETRON HCL 4 MG/2ML IJ SOLN
INTRAMUSCULAR | Status: DC | PRN
  Administered 2012-04-13: 4 mg via INTRAVENOUS

## 2012-04-13 MED ORDER — ONDANSETRON HCL 4 MG/2ML IJ SOLN: 4.0000 mg | INTRAMUSCULAR | Status: DC | PRN

## 2012-04-13 MED ORDER — KETOROLAC TROMETHAMINE 30 MG/ML IJ SOLN: 30.0000 mg | Freq: Four times a day (QID) | INTRAMUSCULAR | Status: DC | PRN

## 2012-04-13 MED ORDER — DIPHENHYDRAMINE HCL 25 MG PO CAPS: 25.0000 mg | ORAL_CAPSULE | ORAL | Status: DC | PRN

## 2012-04-13 MED ORDER — METOCLOPRAMIDE HCL 5 MG/ML IJ SOLN
10.0000 mg | Freq: Three times a day (TID) | INTRAMUSCULAR | Status: DC | PRN
  Administered 2012-04-13: 10 mg via INTRAVENOUS

## 2012-04-13 MED ORDER — MIDAZOLAM HCL 2 MG/2ML IJ SOLN: 0.5000 mg | Freq: Once | INTRAMUSCULAR | Status: DC | PRN

## 2012-04-13 SURGICAL SUPPLY — 29 items
BARRIER ADHS 3X4 INTERCEED (GAUZE/BANDAGES/DRESSINGS) IMPLANT
CLOTH BEACON ORANGE TIMEOUT ST (SAFETY) ×2 IMPLANT
CONTAINER PREFILL 10% NBF 15ML (MISCELLANEOUS) IMPLANT
DRAPE SURG 17X23 STRL (DRAPES) ×2 IMPLANT
DRSG COVADERM 4X10 (GAUZE/BANDAGES/DRESSINGS) ×2 IMPLANT
DURAPREP 26ML APPLICATOR (WOUND CARE) ×2 IMPLANT
ELECT REM PT RETURN 9FT ADLT (ELECTROSURGICAL) ×2
ELECTRODE REM PT RTRN 9FT ADLT (ELECTROSURGICAL) ×1 IMPLANT
EXTRACTOR VACUUM M CUP 4 TUBE (SUCTIONS) IMPLANT
GLOVE BIO SURGEON STRL SZ 6.5 (GLOVE) ×4 IMPLANT
GOWN PREVENTION PLUS LG XLONG (DISPOSABLE) ×6 IMPLANT
KIT ABG SYR 3ML LUER SLIP (SYRINGE) IMPLANT
NEEDLE HYPO 22GX1.5 SAFETY (NEEDLE) IMPLANT
NEEDLE HYPO 25X5/8 SAFETYGLIDE (NEEDLE) ×2 IMPLANT
NS IRRIG 1000ML POUR BTL (IV SOLUTION) ×2 IMPLANT
PACK C SECTION WH (CUSTOM PROCEDURE TRAY) ×2 IMPLANT
PAD OB MATERNITY 4.3X12.25 (PERSONAL CARE ITEMS) ×2 IMPLANT
SLEEVE SCD COMPRESS KNEE MED (MISCELLANEOUS) IMPLANT
STAPLER VISISTAT 35W (STAPLE) IMPLANT
SUT CHROMIC 0 CTX 36 (SUTURE) ×4 IMPLANT
SUT PLAIN 0 NONE (SUTURE) IMPLANT
SUT PLAIN 2 0 XLH (SUTURE) IMPLANT
SUT VIC AB 0 CT1 27 (SUTURE) ×3
SUT VIC AB 0 CT1 27XBRD ANBCTR (SUTURE) ×3 IMPLANT
SUT VIC AB 4-0 KS 27 (SUTURE) IMPLANT
SYR CONTROL 10ML LL (SYRINGE) IMPLANT
TOWEL OR 17X24 6PK STRL BLUE (TOWEL DISPOSABLE) ×4 IMPLANT
TRAY FOLEY CATH 14FR (SET/KITS/TRAYS/PACK) ×2 IMPLANT
WATER STERILE IRR 1000ML POUR (IV SOLUTION) ×2 IMPLANT

## 2012-04-13 NOTE — Anesthesia Postprocedure Evaluation (Signed)
Anesthesia Post Note  Patient: Heather Powell  Procedure(s) Performed: Procedure(s) (LRB): CESAREAN SECTION (N/A)  Anesthesia type: Epidural  Patient location: PACU  Post pain: Pain level controlled  Post assessment: Post-op Vital signs reviewed  Last Vitals:  Filed Vitals:   04/13/12 1615  BP:   Pulse: 80  Temp: 36.7 C  Resp: 20    Post vital signs: Reviewed  Level of consciousness: awake  Complications: No apparent anesthesia complications

## 2012-04-13 NOTE — Progress Notes (Signed)
Patient has been in good labor pattern all day long Fetal heart rate ok but periods of time of repetitive variables Cervix is unchanged since this am Fetal vertex is in transverse position and completely unchanged I feel since the patient has not changed in over 4 hours she needs Primary LTCS Risks reviewed with the patient Consent signed. She also wants skin tags removed as well.

## 2012-04-13 NOTE — Transfer of Care (Signed)
Immediate Anesthesia Transfer of Care Note  Patient: Heather Powell  Procedure(s) Performed: Procedure(s) (LRB) with comments: CESAREAN SECTION (N/A)  Patient Location: PACU  Anesthesia Type:Epidural  Level of Consciousness: awake  Airway & Oxygen Therapy: Patient Spontanous Breathing  Post-op Assessment: Report given to PACU RN and Post -op Vital signs reviewed and stable  Post vital signs: stable  Complications: No apparent anesthesia complications

## 2012-04-13 NOTE — Anesthesia Procedure Notes (Signed)

## 2012-04-13 NOTE — Brief Op Note (Signed)
04/12/2012 - 04/13/2012  2:58 PM  PATIENT:  Heather Powell  36 y.o. female  PRE-OPERATIVE DIAGNOSIS:  failure to progress  POST-OPERATIVE DIAGNOSIS:  failure to progress  PROCEDURE:  Procedure(s) (LRB) with comments: CESAREAN SECTION (N/A) Low Transvese  SURGEON:  Surgeon(s) and Role:    * Jeani Hawking, MD - Primary  PHYSICIAN ASSISTANT:   ASSISTANTS: none   ANESTHESIA:   epidural  EBL:  Total I/O In: 1300 [I.V.:1300] Out: 450 [Urine:50; Blood:400]  BLOOD ADMINISTERED:none  DRAINS: Urinary Catheter (Foley)   LOCAL MEDICATIONS USED:  NONE  SPECIMEN:  No Specimen  DISPOSITION OF SPECIMEN:  N/A  COUNTS:  YES  TOURNIQUET:  * No tourniquets in log *  DICTATION: .Other Dictation: Dictation Number [id number]  PLAN OF CARE: Admit to inpatient   PATIENT DISPOSITION:  PACU - hemodynamically stable.   Delay start of Pharmacological VTE agent (>24hrs) due to surgical blood loss or risk of bleeding: yes

## 2012-04-13 NOTE — Progress Notes (Signed)
36 year old admitted yesterday  SROM greater than 24 hours Now with epidural and on 1 mu Pitocin  FHR is reactive Toco irregular contractions Cervix is 100%/ 3 to 4 -2 Fetal vertex is in transverse position  IMPRESSION: PROLONGED SROM  PLAN: Increase Pitocin IUPC Placed Monitor labor curve closely Plan discussed with patient

## 2012-04-13 NOTE — Consult Note (Signed)
Neonatology Note:   Attendance at C-section:    I was asked to attend this primary C/S at term. The mother is a G1P0 A neg, GBS neg on Ampicillin since last PM. She was afebrile during labor. ROM 38 hours prior to delivery, fluid clear. Vacuum-assisted delivery. Infant vigorous with good spontaneous cry and tone. Needed only minimal bulb suctioning. Ap 8/9. Lungs clear to ausc in DR. To CN to care of Pediatrician.   Deatra James, MD

## 2012-04-13 NOTE — Anesthesia Preprocedure Evaluation (Signed)

## 2012-04-14 ENCOUNTER — Encounter (HOSPITAL_COMMUNITY): Payer: Self-pay | Admitting: Obstetrics and Gynecology

## 2012-04-14 LAB — CBC
HCT: 32.7 % — ABNORMAL LOW (ref 36.0–46.0)
Hemoglobin: 11.1 g/dL — ABNORMAL LOW (ref 12.0–15.0)
RBC: 3.41 MIL/uL — ABNORMAL LOW (ref 3.87–5.11)
RDW: 13.3 % (ref 11.5–15.5)
WBC: 12.2 10*3/uL — ABNORMAL HIGH (ref 4.0–10.5)

## 2012-04-14 MED ORDER — RHO D IMMUNE GLOBULIN 1500 UNIT/2ML IJ SOLN
300.0000 ug | Freq: Once | INTRAMUSCULAR | Status: AC
  Administered 2012-04-14: 300 ug via INTRAMUSCULAR
  Filled 2012-04-14: qty 2

## 2012-04-14 NOTE — Addendum Note (Signed)
Addendum  created 04/14/12 0810 by Suella Grove, CRNA   Modules edited:Notes Section

## 2012-04-14 NOTE — Anesthesia Postprocedure Evaluation (Signed)
  Anesthesia Post-op Note  Patient: Heather Powell  Procedure(s) Performed: Procedure(s) (LRB) with comments: CESAREAN SECTION (N/A)  Patient Location: Mother/Baby  Anesthesia Type:Epidural  Level of Consciousness: awake and alert   Airway and Oxygen Therapy: Patient Spontanous Breathing  Post-op Pain: none  Post-op Assessment: Patient's Cardiovascular Status Stable, Respiratory Function Stable, Patent Airway, No signs of Nausea or vomiting, Adequate PO intake, Pain level controlled, No headache, No backache, No residual numbness and No residual motor weakness  Post-op Vital Signs: Reviewed and stable  Complications: No apparent anesthesia complications

## 2012-04-14 NOTE — Progress Notes (Signed)
Subjective: Postpartum Day 1: Cesarean Delivery Patient reports tolerating PO and + flatus.    Objective: Vital signs in last 24 hours: Temp:  [97.7 F (36.5 C)-98.5 F (36.9 C)] 97.7 F (36.5 C) (11/14 0545) Pulse Rate:  [62-90] 68  (11/14 0545) Resp:  [16-20] 20  (11/14 0545) BP: (101-134)/(48-86) 117/68 mmHg (11/14 0545) SpO2:  [95 %-100 %] 97 % (11/14 0545)  Physical Exam:  General: alert and cooperative Lochia: appropriate Uterine Fundus: firm Incision: abd dressing noted with small old drainage noted on bandage DVT Evaluation: No evidence of DVT seen on physical exam. No significant calf/ankle edema. Foley discontinued , has not voided at the time of rounding   Basename 04/14/12 0515 04/12/12 1320  HGB 11.1* 13.1  HCT 32.7* 38.2    Assessment/Plan: Status post Cesarean section. Doing well postoperatively.  Continue current care.  CURTIS,CAROL G 04/14/2012, 8:21 AM

## 2012-04-14 NOTE — Op Note (Signed)
NAMESHALIN, LINDERS NO.:  [id number]  MEDICAL RECORD NO.:  [id number]  LOCATION:  9132                          FACILITY:  [facility]  PHYSICIAN:  Chelsa Stout L. Syncere Eble, M.D.DATE OF BIRTH:  03-17-1976  DATE OF PROCEDURE:  04/13/2012 DATE OF DISCHARGE:                              OPERATIVE REPORT   PREOPERATIVE DIAGNOSIS:  Failure to progress and prolonged rupture of membranes.  POSTOPERATIVE DIAGNOSIS:  Failure to progress and prolonged rupture of membranes.  PROCEDURE:  Primary low transverse cesarean section and removal of 2 skin tags on the labia.  SURGEON:  Hollin Crewe L. Divina Neale, MD  ANESTHESIA:  Epidural.  EBL:  Less than 500 mL.  COMPLICATIONS:  None.  DRAINS:  Foley.  PATHOLOGY:  None.  PROCEDURE:  The patient was taken to the operating room.  She was then prepped and draped in usual sterile fashion.  A Foley catheter had been inserted while on labor and delivery.  A low transverse incision was made, carried down to the fascia.  Fascia was scored in midline and extended laterally.  The rectus muscles were separated in the midline. The peritoneum was entered bluntly.  The peritoneal incision was then stretched.  The bladder blade was inserted and the bladder flap was then created.  A low transverse incision was made in the uterus and the baby's head was in transverse position.  The baby's head was rotated and was delivered easily with a vacuum extractor with 1 pull, no pop offs. The baby was a female infant, Apgars 8 at 1 minute, 9 at 5 minutes.  There was a double nuchal cord x2.  The cord was clamped and cut.  The baby was handed to the awaiting pediatricians of course and taken to the newborn nursery.  The cord was clamped and cut.  After that, then the placenta was manually removed, noted to be normal, intact with a 3- vessel cord.  The uterus was exteriorized.  It was cleared of all clots and debris.  The uterine incision was closed in 1 layer using  0 chromic in a running, locked stitch.  Irrigation was performed.  The peritoneum was closed using 0 Vicryl.  The rectus muscles were reapproximated with 0 Vicryl.  The fascia was closed using 0 Vicryl running stitch.  After irrigation of subcutaneous layer, the skin was closed with staples.  All sponge, lap, and instrument counts were correct x2.  She did have 2 small skin tags, 1 on the right labia, 1 on the left that she requested to have removed.  We removed those with scissors and sent those to Pathology.     Dickie Labarre L. Vincente Poli, M.D.     Florestine Avers  D:  04/13/2012  T:  04/14/2012  Job:  [id number]

## 2012-04-15 LAB — RH IG WORKUP (INCLUDES ABO/RH)
ABO/RH(D): A NEG
Antibody Screen: NEGATIVE

## 2012-04-15 NOTE — Progress Notes (Signed)
Subjective: Postpartum Day 2: Cesarean Delivery Patient reports tolerating PO, + flatus and no problems voiding.    Objective: Vital signs in last 24 hours: Temp:  [97.5 F (36.4 C)-98 F (36.7 C)] 98 F (36.7 C) (11/15 0506) Pulse Rate:  [64-73] 64  (11/15 0506) Resp:  [18] 18  (11/15 0506) BP: (107-110)/(67-71) 110/71 mmHg (11/15 0506) SpO2:  [98 %] 98 % (11/14 1006)  Physical Exam:  General: alert and cooperative Lochia: appropriate Uterine Fundus: firm Incision: staples intact DVT Evaluation: No evidence of DVT seen on physical exam. No significant calf/ankle edema.   Basename 04/14/12 0515 04/12/12 1320  HGB 11.1* 13.1  HCT 32.7* 38.2    Assessment/Plan: Status post Cesarean section. Doing well postoperatively.  Continue current care.  Myer Bohlman G 04/15/2012, 8:30 AM

## 2012-04-16 MED ORDER — IBUPROFEN 600 MG PO TABS: 600.0000 mg | ORAL_TABLET | Freq: Four times a day (QID) | ORAL | Status: DC

## 2012-04-16 MED ORDER — MEASLES, MUMPS & RUBELLA VAC ~~LOC~~ INJ
0.5000 mL | INJECTION | Freq: Once | SUBCUTANEOUS | Status: AC
  Administered 2012-04-16: 0.5 mL via SUBCUTANEOUS
  Filled 2012-04-16: qty 0.5

## 2012-04-16 MED ORDER — OXYCODONE-ACETAMINOPHEN 5-325 MG PO TABS: 1.0000 | ORAL_TABLET | ORAL | Status: DC | PRN

## 2012-04-16 NOTE — Discharge Summary (Signed)
Obstetric Discharge Summary Reason for Admission: rupture of membranes Prenatal Procedures: ultrasound Intrapartum Procedures: cesarean: low cervical, transverse Postpartum Procedures: none Complications-Operative and Postpartum: none Hemoglobin  Date Value Range Status  04/14/2012 11.1* 12.0 - 15.0 g/dL Final     HCT  Date Value Range Status  04/14/2012 32.7* 36.0 - 46.0 % Final    Physical Exam:  General: alert and cooperative Lochia: appropriate Uterine Fundus: firm Incision: healing well, no significant drainage DVT Evaluation: No evidence of DVT seen on physical exam.  Discharge Diagnoses: Term Pregnancy-delivered  Discharge Information: Date: 04/16/2012 Activity: pelvic rest Diet: routine Medications: PNV and Percocet Condition: stable Instructions: refer to practice specific booklet Discharge to: home Follow-up Information    Schedule an appointment as soon as possible for a visit in 2 weeks to follow up.         Newborn Data: Live born female  Birth Weight: 7 lb 10.9 oz (3485 g) APGAR: 8, 9  Home with mother.  Heather Powell 04/16/2012, 9:36 AM

## 2012-11-22 ENCOUNTER — Other Ambulatory Visit (INDEPENDENT_AMBULATORY_CARE_PROVIDER_SITE_OTHER): Payer: Self-pay

## 2012-11-22 DIAGNOSIS — Z111 Encounter for screening for respiratory tuberculosis: Secondary | ICD-10-CM

## 2012-12-26 ENCOUNTER — Other Ambulatory Visit: Payer: Self-pay | Admitting: Obstetrics and Gynecology

## 2012-12-26 DIAGNOSIS — N6310 Unspecified lump in the right breast, unspecified quadrant: Secondary | ICD-10-CM

## 2013-01-02 ENCOUNTER — Other Ambulatory Visit: Payer: BC Managed Care – PPO

## 2013-01-03 ENCOUNTER — Other Ambulatory Visit: Payer: BC Managed Care – PPO

## 2013-04-20 LAB — OB RESULTS CONSOLE ABO/RH: RH Type: NEGATIVE

## 2013-04-20 LAB — OB RESULTS CONSOLE RUBELLA ANTIBODY, IGM: Rubella: IMMUNE

## 2013-04-20 LAB — OB RESULTS CONSOLE ANTIBODY SCREEN: Antibody Screen: NEGATIVE

## 2013-04-20 LAB — OB RESULTS CONSOLE HIV ANTIBODY (ROUTINE TESTING): HIV: NONREACTIVE

## 2013-04-20 LAB — OB RESULTS CONSOLE HEPATITIS B SURFACE ANTIGEN: Hepatitis B Surface Ag: NEGATIVE

## 2013-04-24 LAB — CBC AND DIFFERENTIAL
HCT: 35 — AB (ref 36–46)
Hemoglobin: 11.8 — AB (ref 12.0–16.0)
Platelets: 315 (ref 150–399)
WBC: 6.6

## 2013-04-24 LAB — HM HIV SCREENING LAB: HM HIV Screening: NEGATIVE

## 2013-04-24 LAB — HEPATITIS B SURFACE ANTIGEN: Hepatitis B Surface Ag: NEGATIVE

## 2013-05-12 LAB — HM PAP SMEAR: HM Pap smear: NORMAL

## 2013-11-20 ENCOUNTER — Encounter (HOSPITAL_COMMUNITY): Payer: Self-pay

## 2013-11-20 ENCOUNTER — Inpatient Hospital Stay (HOSPITAL_COMMUNITY)
Admission: AD | Admit: 2013-11-20 | Discharge: 2013-11-20 | Disposition: A | Discharge status: Home / Self Care | Payer: BC Managed Care – PPO | Source: Ambulatory Visit | Attending: Obstetrics and Gynecology | Admitting: Obstetrics and Gynecology

## 2013-11-20 DIAGNOSIS — O4693 Antepartum hemorrhage, unspecified, third trimester: Secondary | ICD-10-CM

## 2013-11-20 DIAGNOSIS — O4690 Antepartum hemorrhage, unspecified, unspecified trimester: Secondary | ICD-10-CM

## 2013-11-20 LAB — WET PREP, GENITAL
CLUE CELLS WET PREP: NONE SEEN
TRICH WET PREP: NONE SEEN
YEAST WET PREP: NONE SEEN

## 2013-11-20 NOTE — MAU Note (Signed)
Patient states she has had a little amount of bright red spotting, no active bleeding. States she feel movement but less than usual. Has some mild contractions. Patient is scheduled for a repeat cesarean on 7-1 but will try for a TOLAC if she goes into labor.

## 2013-11-20 NOTE — Discharge Instructions (Signed)
Reasons to return to MAU:  1.  Contractions are  5 minutes apart or less, each last 1 minute, these have been going on for 1-2 hours, and you cannot walk or talk during them 2.  You have a large gush of fluid, or a trickle of fluid that will not stop and you have to wear a pad 3.  You have bleeding that is bright red, heavier than spotting--like menstrual bleeding (spotting can be normal in early labor or after a check of your cervix) 4.  You do not feel the baby moving like he/she normally does  Vaginal Bleeding During Pregnancy, Third Trimester A small amount of bleeding (spotting) from the vagina is relatively common in pregnancy. Various things can cause bleeding or spotting in pregnancy. Sometimes the bleeding is normal and is not a problem. However, bleeding during the third trimester can also be a sign of something serious for the mother and the baby. Be sure to tell your health care provider about any vaginal bleeding right away.  Some possible causes of vaginal bleeding during the third trimester include:   The placenta may be partially or completely covering the opening to the cervix (placenta previa).   The placenta may have separated from the uterus (abruption of the placenta).   There may be an infection or growth on the cervix.   You may be starting labor, called discharging of the mucus plug.   The placenta may grow into the muscle layer of the uterus (placenta accreta).  HOME CARE INSTRUCTIONS  Watch your condition for any changes. The following actions may help to lessen any discomfort you are feeling:   Follow your health care provider's instructions for limiting your activity. If your health care provider orders bed rest, you may need to stay in bed and only get up to use the bathroom. However, your health care provider may allow you to continue light activity.  If needed, make plans for someone to help with your regular activities and responsibilities while you are  on bed rest.  Keep track of the number of pads you use each day, how often you change pads, and how soaked (saturated) they are. Write this down.  Do not use tampons. Do not douche.  Do not have sexual intercourse or orgasms until approved by your health care provider.  Follow your health care provider's advice about lifting, driving, and physical activities.  If you pass any tissue from your vagina, save the tissue so you can show it to your health care provider.   Only take over-the-counter or prescription medicines as directed by your health care provider.  Do not take aspirin because it can make you bleed.   Keep all follow-up appointments as directed by your health care provider. SEEK MEDICAL CARE IF:  You have any vaginal bleeding during any part of your pregnancy.  You have cramps or labor pains.  You have a fever, not controlled by medicine. SEEK IMMEDIATE MEDICAL CARE IF:   You have severe cramps or pain in your back or belly (abdomen).  You have chills.  You have a gush of fluid from the vagina.  You pass large clots or tissue from your vagina.  Your bleeding increases.  You feel light-headed or weak.  You pass out.  You feel less movement or no movement of the baby.  MAKE SURE YOU:  Understand these instructions.  Will watch your condition.  Will get help right away if you are not doing well or  get worse. Document Released: 08/08/2002 Document Revised: 05/23/2013 Document Reviewed: 01/23/2013 [facility] Patient Information 2015 [place], [place]. This information is not intended to replace advice given to you by your health care provider. Make sure you discuss any questions you have with your health care provider.

## 2013-11-20 NOTE — MAU Provider Note (Signed)
Chief Complaint:  Vaginal Bleeding   First Provider Initiated Contact with Patient 11/20/13 1506      HPI: Heather Powell is a 38 y.o. G2P1001 at 42w3dwho presents to maternity admissions reporting she had bright red spotting when wiping x2-3 episodes today.  She called the office and was told to come to MAU for evaluation.  She reports less fetal movement than usual today but she is feeling movement since arrival in MAU.  She denies recent intercourse or change in physical activity.  She has hx of C/S x1 and would like a TOLAC with this pregnancy but her repeat C/S is scheduled 10/30/13.  She denies regular contractions, LOF, vaginal itching/burning, urinary symptoms, h/a, dizziness, n/v, or fever/chills.    Past Medical History: Past Medical History  Diagnosis Date  . No pertinent past medical history   . Abnormal Pap smear     Past obstetric history: OB History  Gravida Para Term Preterm AB SAB TAB Ectopic Multiple Living  2 1 1  0 0 0 0 0 0 1    # Outcome Date GA Lbr Len/2nd Weight Sex Delivery Anes PTL Lv  2 CUR           1 TRM 04/13/12 [redacted]w[redacted]d  3.485 kg (7 lb 10.9 oz) M LTCS EPI  Y     Comments: na      Past Surgical History: Past Surgical History  Procedure Laterality Date  . Breast lumpectomy    . Cesarean section  04/13/2012    Procedure: CESAREAN SECTION;  Surgeon: Cyril Mourning, MD;  Location: [facility];  Service: Obstetrics;  Laterality: N/A;    Family History: Family History  Problem Relation Age of Onset  . Other Neg Hx     Social History: History  Substance Use Topics  . Smoking status: Never Smoker   . Smokeless tobacco: Not on file  . Alcohol Use: No    Allergies:  Allergies  Allergen Reactions  . Codeine Nausea And Vomiting  . Hydromorphone Nausea And Vomiting    Meds:  Prescriptions prior to admission  Medication Sig Dispense Refill  . Prenatal Vit-Fe Fumarate-FA (PRENATAL MULTIVITAMIN) TABS Take 1 tablet by mouth daily.        ROS:  Pertinent findings in history of present illness.  Physical Exam  Blood pressure 130/79, pulse 88, temperature 98.8 F (37.1 C), temperature source Oral, resp. rate 16, height 5\' 5"  (1.651 m), weight 85.004 kg (187 lb 6.4 oz), SpO2 100.00%, unknown if currently breastfeeding. GENERAL: Well-developed, well-nourished female in no acute distress.  HEENT: normocephalic HEART: normal rate RESP: normal effort ABDOMEN: Soft, non-tender, gravid appropriate for gestational age EXTREMITIES: Nontender, no edema NEURO: alert and oriented Pelvic exam: Cervix pink, visually closed, without lesion, large amount white creamy discharge, vaginal walls and external genitalia normal  Dilation: Closed Effacement (%): Thick Cervical Position: Posterior Station: -3 Exam by:: Lisa leftwich-kirby, cnm  FHT:  Baseline 135, moderate variability, accelerations present, no decelerations Contractions: None on toco or to palpation    Assessment: 1. Vaginal bleeding in pregnancy, third trimester     Plan: Consult Dr Gaetano Net Discharge home with bleeding precautions Labor precautions and fetal kick counts Make appt in office this week Return to MAU as needed      Follow-up Information   Schedule an appointment as soon as possible for a visit with [facility] (This week. Return to MAU as needed.)    Contact information:   [address]  [place] [phone]       Medication List         prenatal multivitamin Tabs tablet  Take 1 tablet by mouth daily.        Fatima Blank Certified Nurse-Midwife 11/20/2013 3:41 PM

## 2013-11-21 ENCOUNTER — Encounter (HOSPITAL_COMMUNITY): Payer: Self-pay

## 2013-11-27 ENCOUNTER — Encounter (HOSPITAL_COMMUNITY): Payer: Self-pay

## 2013-11-28 ENCOUNTER — Encounter (HOSPITAL_COMMUNITY): Payer: Self-pay | Admitting: Pharmacy Technician

## 2013-11-28 ENCOUNTER — Other Ambulatory Visit: Payer: Self-pay | Admitting: Adult Health

## 2013-11-28 ENCOUNTER — Encounter (HOSPITAL_COMMUNITY)
Admission: RE | Admit: 2013-11-28 | Discharge: 2013-11-28 | Disposition: A | Discharge status: Home / Self Care | Payer: BC Managed Care – PPO | Source: Ambulatory Visit | Attending: Obstetrics and Gynecology | Admitting: Obstetrics and Gynecology

## 2013-11-28 ENCOUNTER — Encounter (HOSPITAL_COMMUNITY): Payer: Self-pay

## 2013-11-28 LAB — CBC
HCT: 35.6 % — ABNORMAL LOW (ref 36.0–46.0)
Hemoglobin: 12.1 g/dL (ref 12.0–15.0)
MCH: 32.2 pg (ref 26.0–34.0)
MCHC: 34 g/dL (ref 30.0–36.0)
MCV: 94.7 fL (ref 78.0–100.0)
Platelets: 276 10*3/uL (ref 150–400)
RBC: 3.76 MIL/uL — ABNORMAL LOW (ref 3.87–5.11)
RDW: 13.1 % (ref 11.5–15.5)
WBC: 9.2 10*3/uL (ref 4.0–10.5)

## 2013-11-28 LAB — RPR

## 2013-11-28 NOTE — Patient Instructions (Signed)
[place]  11/28/2013   Your procedure is scheduled on:  11/29/13  Enter through the Main Entrance of [facility] [place] up the phone at the desk and dial [phone].   Call this number if you have problems the morning of surgery: [phone]   Remember:   Do not eat food:After Midnight.  Do not drink clear liquids: After Midnight.  Take these medicines the morning of surgery with A SIP OF WATER: NA   Do not wear jewelry, make-up or nail polish.  Do not wear lotions, powders, or perfumes. You may wear deodorant.  Do not shave 48 hours prior to surgery.  Do not bring valuables to the hospital.  [facility] is not   responsible for any belongings or valuables brought to the hospital.  Contacts, dentures or bridgework may not be worn into surgery.  Leave suitcase in the car. After surgery it may be brought to your room.  For patients admitted to the hospital, checkout time is 11:00 AM the day of              discharge.   Patients discharged the day of surgery will not be allowed to drive             home.  Name and phone number of your driver: NA  Special Instructions:      Please read over the following fact sheets that you were given:   Surgical Site Infection Prevention

## 2013-11-28 NOTE — H&P (Addendum)
Heather Powell is a 38 y.o. female presenting for repeat C/S now at 40 + weeks. Preg complicated by ama with normal NIPT.  GBS -.  Hx of c/s for FTP desires repeat. History OB History   Grav Para Term Preterm Abortions TAB SAB Ect Mult Living   3 1 1  0 1 0 0 0 0 1     Past Medical History  Diagnosis Date  . No pertinent past medical history   . Abnormal Pap smear    Past Surgical History  Procedure Laterality Date  . Cesarean section  04/13/2012    Procedure: CESAREAN SECTION;  Surgeon: Cyril Mourning, MD;  Location: [facility];  Service: Obstetrics;  Laterality: N/A;  . Breast lumpectomy     Family History: family history is negative for Other. Social History:  reports that she has never smoked. She does not have any smokeless tobacco history on file. She reports that she does not drink alcohol or use illicit drugs.   Prenatal Transfer Tool  Maternal Diabetes: No Genetic Screening: Normal Maternal Ultrasounds/Referrals: Normal Fetal Ultrasounds or other Referrals:  None Maternal Substance Abuse:  No Significant Maternal Medications:  None Significant Maternal Lab Results:  None Other Comments:  None  ROS    unknown if currently breastfeeding. Exam Physical Exam  Cx:  Closed thick Prenatal labs: ABO, Rh: --/--/A NEG (06/30 1200) Antibody: POS (06/30 1200) Rubella: Immune (11/20 1501) RPR:    HBsAg: Negative (11/20 1501)  HIV: Non-reactive (11/20 1501)  GBS:     Assessment/Plan: IUP at 39 weeks + Prev C/S desires repeat Risks and benefits of C/S were discussed.  All questions were answered and informed consent was obtained.  Plan to proceed with low segment transverse Cesarean Section.   LOWE,DAVID C 11/28/2013, 6:18 PM  11/29/13  0715 This patient has been seen and examined.   All of her questions were answered.  Labs and vital signs reviewed.  Informed consent has been obtained.  The History and Physical is current. DL

## 2013-11-29 ENCOUNTER — Inpatient Hospital Stay (HOSPITAL_COMMUNITY): Payer: BC Managed Care – PPO | Admitting: Anesthesiology

## 2013-11-29 ENCOUNTER — Other Ambulatory Visit: Payer: Self-pay | Admitting: Adult Health

## 2013-11-29 ENCOUNTER — Encounter: Payer: Self-pay | Admitting: Adult Health

## 2013-11-29 ENCOUNTER — Encounter (HOSPITAL_COMMUNITY)
Admission: RE | Disposition: A | Discharge status: Home / Self Care | Payer: Self-pay | Source: Ambulatory Visit | Attending: Obstetrics and Gynecology

## 2013-11-29 ENCOUNTER — Encounter (HOSPITAL_COMMUNITY): Payer: Self-pay | Admitting: *Deleted

## 2013-11-29 ENCOUNTER — Inpatient Hospital Stay (HOSPITAL_COMMUNITY)
Admission: RE | Admit: 2013-11-29 | Discharge: 2013-12-01 | DRG: 766 | Disposition: A | Discharge status: Home / Self Care | Payer: BC Managed Care – PPO | Source: Ambulatory Visit | Attending: Obstetrics and Gynecology | Admitting: Obstetrics and Gynecology

## 2013-11-29 ENCOUNTER — Encounter (HOSPITAL_COMMUNITY): Payer: BC Managed Care – PPO | Admitting: Anesthesiology

## 2013-11-29 DIAGNOSIS — O34219 Maternal care for unspecified type scar from previous cesarean delivery: Principal | ICD-10-CM | POA: Diagnosis present

## 2013-11-29 DIAGNOSIS — O09529 Supervision of elderly multigravida, unspecified trimester: Secondary | ICD-10-CM | POA: Diagnosis present

## 2013-11-29 SURGERY — Surgical Case
Anesthesia: Spinal | Site: Abdomen

## 2013-11-29 MED ORDER — ONDANSETRON HCL 4 MG/2ML IJ SOLN
INTRAMUSCULAR | Status: DC | PRN
  Administered 2013-11-29: 4 mg via INTRAVENOUS

## 2013-11-29 MED ORDER — KETOROLAC TROMETHAMINE 30 MG/ML IJ SOLN: 30.0000 mg | Freq: Four times a day (QID) | INTRAMUSCULAR | Status: AC | PRN

## 2013-11-29 MED ORDER — NALBUPHINE HCL 10 MG/ML IJ SOLN: 5.0000 mg | INTRAMUSCULAR | Status: DC | PRN

## 2013-11-29 MED ORDER — MORPHINE SULFATE (PF) 0.5 MG/ML IJ SOLN
INTRAMUSCULAR | Status: DC | PRN
  Administered 2013-11-29: 150 ug via INTRATHECAL

## 2013-11-29 MED ORDER — SENNOSIDES-DOCUSATE SODIUM 8.6-50 MG PO TABS
2.0000 | ORAL_TABLET | ORAL | Status: DC
  Administered 2013-11-29 – 2013-11-30 (×2): 2 via ORAL
  Filled 2013-11-29 (×2): qty 2

## 2013-11-29 MED ORDER — TETANUS-DIPHTH-ACELL PERTUSSIS 5-2.5-18.5 LF-MCG/0.5 IM SUSP: 0.5000 mL | Freq: Once | INTRAMUSCULAR | Status: DC

## 2013-11-29 MED ORDER — OXYTOCIN 40 UNITS IN LACTATED RINGERS INFUSION - SIMPLE MED: 62.5000 mL/h | INTRAVENOUS | Status: AC

## 2013-11-29 MED ORDER — NALOXONE HCL 0.4 MG/ML IJ SOLN: 0.4000 mg | INTRAMUSCULAR | Status: DC | PRN

## 2013-11-29 MED ORDER — SIMETHICONE 80 MG PO CHEW: 80.0000 mg | CHEWABLE_TABLET | ORAL | Status: DC | PRN

## 2013-11-29 MED ORDER — ZOLPIDEM TARTRATE 5 MG PO TABS: 5.0000 mg | ORAL_TABLET | Freq: Every evening | ORAL | Status: DC | PRN

## 2013-11-29 MED ORDER — PHENYLEPHRINE 8 MG IN D5W 100 ML (0.08MG/ML) PREMIX OPTIME
INJECTION | INTRAVENOUS | Status: DC | PRN
  Administered 2013-11-29: 60 ug/min via INTRAVENOUS

## 2013-11-29 MED ORDER — MENTHOL 3 MG MT LOZG: 1.0000 | LOZENGE | OROMUCOSAL | Status: DC | PRN

## 2013-11-29 MED ORDER — SIMETHICONE 80 MG PO CHEW
80.0000 mg | CHEWABLE_TABLET | Freq: Three times a day (TID) | ORAL | Status: DC
  Administered 2013-11-29 – 2013-12-01 (×5): 80 mg via ORAL
  Filled 2013-11-29 (×5): qty 1

## 2013-11-29 MED ORDER — SERTRALINE HCL 50 MG PO TABS
50.0000 mg | ORAL_TABLET | Freq: Every day | ORAL | Status: DC
  Administered 2013-11-30 – 2013-12-01 (×2): 50 mg via ORAL
  Filled 2013-11-29 (×2): qty 1

## 2013-11-29 MED ORDER — OXYTOCIN 10 UNIT/ML IJ SOLN
INTRAMUSCULAR | Status: AC
  Filled 2013-11-29: qty 4

## 2013-11-29 MED ORDER — KETOROLAC TROMETHAMINE 30 MG/ML IJ SOLN
INTRAMUSCULAR | Status: AC
  Administered 2013-11-29: 30 mg via INTRAMUSCULAR
  Filled 2013-11-29: qty 1

## 2013-11-29 MED ORDER — BUPIVACAINE HCL (PF) 0.5 % IJ SOLN
INTRAMUSCULAR | Status: DC | PRN
  Administered 2013-11-29: 11 mg

## 2013-11-29 MED ORDER — LANOLIN HYDROUS EX OINT: 1.0000 "application " | TOPICAL_OINTMENT | CUTANEOUS | Status: DC | PRN

## 2013-11-29 MED ORDER — FENTANYL CITRATE 0.05 MG/ML IJ SOLN: 25.0000 ug | INTRAMUSCULAR | Status: DC | PRN

## 2013-11-29 MED ORDER — SCOPOLAMINE 1 MG/3DAYS TD PT72
1.0000 | MEDICATED_PATCH | Freq: Once | TRANSDERMAL | Status: DC
  Administered 2013-11-29: 1.5 mg via TRANSDERMAL

## 2013-11-29 MED ORDER — LACTATED RINGERS IV SOLN: Freq: Once | INTRAVENOUS | Status: DC

## 2013-11-29 MED ORDER — DIPHENHYDRAMINE HCL 50 MG/ML IJ SOLN: 25.0000 mg | INTRAMUSCULAR | Status: DC | PRN

## 2013-11-29 MED ORDER — MEPERIDINE HCL 25 MG/ML IJ SOLN: 6.2500 mg | INTRAMUSCULAR | Status: DC | PRN

## 2013-11-29 MED ORDER — DEXTROSE 5 % IV SOLN
2.0000 g | INTRAVENOUS | Status: AC
  Administered 2013-11-29: 2 g via INTRAVENOUS
  Filled 2013-11-29: qty 2

## 2013-11-29 MED ORDER — METOCLOPRAMIDE HCL 5 MG/ML IJ SOLN: 10.0000 mg | Freq: Three times a day (TID) | INTRAMUSCULAR | Status: DC | PRN

## 2013-11-29 MED ORDER — ONDANSETRON HCL 4 MG/2ML IJ SOLN: 4.0000 mg | INTRAMUSCULAR | Status: DC | PRN

## 2013-11-29 MED ORDER — DIBUCAINE 1 % RE OINT: 1.0000 "application " | TOPICAL_OINTMENT | RECTAL | Status: DC | PRN

## 2013-11-29 MED ORDER — DIPHENHYDRAMINE HCL 25 MG PO CAPS
25.0000 mg | ORAL_CAPSULE | ORAL | Status: DC | PRN
  Filled 2013-11-29: qty 1

## 2013-11-29 MED ORDER — FENTANYL CITRATE 0.05 MG/ML IJ SOLN
INTRAMUSCULAR | Status: DC | PRN
  Administered 2013-11-29: 25 ug via INTRATHECAL

## 2013-11-29 MED ORDER — ONDANSETRON HCL 4 MG PO TABS: 4.0000 mg | ORAL_TABLET | ORAL | Status: DC | PRN

## 2013-11-29 MED ORDER — ONDANSETRON HCL 4 MG/2ML IJ SOLN
INTRAMUSCULAR | Status: AC
  Filled 2013-11-29: qty 2

## 2013-11-29 MED ORDER — PHENYLEPHRINE 8 MG IN D5W 100 ML (0.08MG/ML) PREMIX OPTIME
INJECTION | INTRAVENOUS | Status: AC
  Filled 2013-11-29: qty 100

## 2013-11-29 MED ORDER — SCOPOLAMINE 1 MG/3DAYS TD PT72: 1.0000 | MEDICATED_PATCH | Freq: Once | TRANSDERMAL | Status: DC

## 2013-11-29 MED ORDER — OXYCODONE-ACETAMINOPHEN 5-325 MG PO TABS: 1.0000 | ORAL_TABLET | ORAL | Status: DC | PRN

## 2013-11-29 MED ORDER — SCOPOLAMINE 1 MG/3DAYS TD PT72
MEDICATED_PATCH | TRANSDERMAL | Status: AC
  Administered 2013-11-29: 1.5 mg via TRANSDERMAL
  Filled 2013-11-29: qty 1

## 2013-11-29 MED ORDER — OXYTOCIN 10 UNIT/ML IJ SOLN
40.0000 [IU] | INTRAMUSCULAR | Status: DC | PRN
  Administered 2013-11-29: 40 [IU] via INTRAVENOUS

## 2013-11-29 MED ORDER — PRENATAL MULTIVITAMIN CH
1.0000 | ORAL_TABLET | Freq: Every day | ORAL | Status: DC
  Filled 2013-11-29: qty 1

## 2013-11-29 MED ORDER — KETOROLAC TROMETHAMINE 30 MG/ML IJ SOLN
30.0000 mg | Freq: Four times a day (QID) | INTRAMUSCULAR | Status: AC | PRN
  Administered 2013-11-29: 30 mg via INTRAMUSCULAR

## 2013-11-29 MED ORDER — FENTANYL CITRATE 0.05 MG/ML IJ SOLN
INTRAMUSCULAR | Status: AC
  Filled 2013-11-29: qty 2

## 2013-11-29 MED ORDER — NALOXONE HCL 1 MG/ML IJ SOLN
1.0000 ug/kg/h | INTRAMUSCULAR | Status: DC | PRN
  Filled 2013-11-29: qty 2

## 2013-11-29 MED ORDER — LACTATED RINGERS IV SOLN
INTRAVENOUS | Status: DC
  Administered 2013-11-29: 18:00:00 via INTRAVENOUS

## 2013-11-29 MED ORDER — SIMETHICONE 80 MG PO CHEW
80.0000 mg | CHEWABLE_TABLET | ORAL | Status: DC
  Administered 2013-11-29: 80 mg via ORAL
  Filled 2013-11-29 (×2): qty 1

## 2013-11-29 MED ORDER — LACTATED RINGERS IV SOLN
INTRAVENOUS | Status: DC
  Administered 2013-11-29 (×3): via INTRAVENOUS

## 2013-11-29 MED ORDER — ONDANSETRON HCL 4 MG/2ML IJ SOLN: 4.0000 mg | Freq: Three times a day (TID) | INTRAMUSCULAR | Status: DC | PRN

## 2013-11-29 MED ORDER — IBUPROFEN 600 MG PO TABS
600.0000 mg | ORAL_TABLET | Freq: Four times a day (QID) | ORAL | Status: DC
  Administered 2013-11-29 – 2013-12-01 (×7): 600 mg via ORAL
  Filled 2013-11-29 (×7): qty 1

## 2013-11-29 MED ORDER — SODIUM CHLORIDE 0.9 % IJ SOLN: 3.0000 mL | INTRAMUSCULAR | Status: DC | PRN

## 2013-11-29 MED ORDER — DIPHENHYDRAMINE HCL 50 MG/ML IJ SOLN: 12.5000 mg | INTRAMUSCULAR | Status: DC | PRN

## 2013-11-29 MED ORDER — DIPHENHYDRAMINE HCL 25 MG PO CAPS: 25.0000 mg | ORAL_CAPSULE | Freq: Four times a day (QID) | ORAL | Status: DC | PRN

## 2013-11-29 MED ORDER — WITCH HAZEL-GLYCERIN EX PADS: 1.0000 "application " | MEDICATED_PAD | CUTANEOUS | Status: DC | PRN

## 2013-11-29 MED ORDER — MORPHINE SULFATE 0.5 MG/ML IJ SOLN
INTRAMUSCULAR | Status: AC
  Filled 2013-11-29: qty 10

## 2013-11-29 SURGICAL SUPPLY — 26 items
CLAMP CORD UMBIL (MISCELLANEOUS) IMPLANT
CLOTH BEACON ORANGE TIMEOUT ST (SAFETY) ×3 IMPLANT
DRAPE LG THREE QUARTER DISP (DRAPES) IMPLANT
DRSG OPSITE POSTOP 4X10 (GAUZE/BANDAGES/DRESSINGS) ×3 IMPLANT
DURAPREP 26ML APPLICATOR (WOUND CARE) ×3 IMPLANT
ELECT REM PT RETURN 9FT ADLT (ELECTROSURGICAL) ×3
ELECTRODE REM PT RTRN 9FT ADLT (ELECTROSURGICAL) ×1 IMPLANT
EXTRACTOR VACUUM M CUP 4 TUBE (SUCTIONS) IMPLANT
EXTRACTOR VACUUM M CUP 4' TUBE (SUCTIONS)
GLOVE SURG ORTHO 8.0 STRL STRW (GLOVE) ×3 IMPLANT
GOWN STRL REUS W/TWL LRG LVL3 (GOWN DISPOSABLE) ×6 IMPLANT
KIT ABG SYR 3ML LUER SLIP (SYRINGE) ×3 IMPLANT
NEEDLE HYPO 25X5/8 SAFETYGLIDE (NEEDLE) ×3 IMPLANT
NS IRRIG 1000ML POUR BTL (IV SOLUTION) ×3 IMPLANT
PACK C SECTION WH (CUSTOM PROCEDURE TRAY) ×3 IMPLANT
PAD OB MATERNITY 4.3X12.25 (PERSONAL CARE ITEMS) ×3 IMPLANT
STAPLER VISISTAT 35W (STAPLE) IMPLANT
SUT MNCRL 0 VIOLET CTX 36 (SUTURE) ×3 IMPLANT
SUT MONOCRYL 0 CTX 36 (SUTURE) ×6
SUT PDS AB 1 CT  36 (SUTURE)
SUT PDS AB 1 CT 36 (SUTURE) IMPLANT
SUT VIC AB 1 CTX 36 (SUTURE)
SUT VIC AB 1 CTX36XBRD ANBCTRL (SUTURE) IMPLANT
TOWEL OR 17X24 6PK STRL BLUE (TOWEL DISPOSABLE) ×3 IMPLANT
TRAY FOLEY CATH 14FR (SET/KITS/TRAYS/PACK) ×3 IMPLANT
WATER STERILE IRR 1000ML POUR (IV SOLUTION) IMPLANT

## 2013-11-29 NOTE — Transfer of Care (Signed)
Immediate Anesthesia Transfer of Care Note  Patient: [name]  Procedure(s) Performed: Procedure(s) with comments: CESAREAN SECTION (N/A) - Repeat edc 12/01/13  Patient Location: PACU  Anesthesia Type:Spinal  Level of Consciousness: awake, alert  and oriented  Airway & Oxygen Therapy: Patient Spontanous Breathing  Post-op Assessment: Report given to PACU RN and Post -op Vital signs reviewed and stable  Post vital signs: Reviewed and stable  Complications: No apparent anesthesia complications

## 2013-11-29 NOTE — Addendum Note (Signed)
Addendum created 11/29/13 1658 by Brock Ra, CRNA   Modules edited: Notes Section   Notes Section:  File: [id number]

## 2013-11-29 NOTE — Anesthesia Procedure Notes (Signed)
Spinal  Patient location during procedure: OR Start time: 11/29/2013 7:32 AM Staffing Anesthesiologist: Rudean Curt Performed by: anesthesiologist  Preanesthetic Checklist Completed: patient identified, site marked, surgical consent, pre-op evaluation, timeout performed, IV checked, risks and benefits discussed and monitors and equipment checked Spinal Block Patient position: sitting Prep: DuraPrep Patient monitoring: heart rate, cardiac monitor, continuous pulse ox and blood pressure Approach: midline Location: L3-4 Injection technique: single-shot Needle Needle type: Sprotte  Needle gauge: 24 G Needle length: 9 cm Assessment Sensory level: T4 Additional Notes Patient identified.  Risk benefits discussed including failed block, incomplete pain control, headache, nerve damage, paralysis, blood pressure changes, nausea, vomiting, reactions to medication both toxic or allergic, and postpartum back pain.  Patient expressed understanding and wished to proceed.  All questions were answered.  Sterile technique used throughout procedure.  CSF was clear.  No parasthesia or other complications.  Please see nursing notes for vital signs.

## 2013-11-29 NOTE — Lactation Note (Signed)
This note was copied from the chart of Heather Powell. Lactation Consultation Note  Patient Name: Heather Shakeitha Umbaugh [id number] Date: 11/29/2013 Reason for consult: Initial assessment Baby 10 hours of life. Mom states that BF is going very well, baby latching deeply. Mom has room full of visitors at this time. Enc mom to call if she needs assistance latching baby. Mom states she had no issues nursing first baby. Mom given [facility] brochure, aware of OP/BFSG and community resources. Enc mom to offer lots of STS, and nurse with cues. Mom asking about renting hospital grade DEBP for return to work. Gave mom paper work, Financial trader to [employer], discussed rental prices and prices to purchase.   Maternal Data Has patient been taught Hand Expression?: Yes Does the patient have breastfeeding experience prior to this delivery?: Yes  Feeding Feeding Type:  (Mom has room full of visitors, states breastfeeding going well, even better than first time, which went very well.) Length of feed: 40 min  LATCH Score/Interventions Latch: Grasps breast easily, tongue down, lips flanged, rhythmical sucking.  Audible Swallowing: A few with stimulation  Type of Nipple: Everted at rest and after stimulation  Comfort (Breast/Nipple): Soft / non-tender     Hold (Positioning): No assistance needed to correctly position infant at breast.  LATCH Score: 9  Lactation Tools Discussed/Used     Consult Status Consult Status: Follow-up Date: 11/30/13 Follow-up type: In-patient    Inocente Salles 11/29/2013, 6:40 PM

## 2013-11-29 NOTE — Anesthesia Postprocedure Evaluation (Signed)
Anesthesia Post Note  Patient: Heather Powell  Procedure(s) Performed: Procedure(s) (LRB): CESAREAN SECTION (N/A)  Anesthesia type: Spinal  Patient location: PACU  Post pain: Pain level controlled  Post assessment: Post-op Vital signs reviewed  Last Vitals:  Filed Vitals:   11/29/13 0845  BP:   Pulse: 74  Temp:   Resp: 18    Post vital signs: Reviewed  Level of consciousness: awake  Complications: No apparent anesthesia complications

## 2013-11-29 NOTE — Anesthesia Postprocedure Evaluation (Signed)
  Anesthesia Post-op Note  Patient: Heather Powell  Procedure(s) Performed: Procedure(s) with comments: CESAREAN SECTION (N/A) - Repeat edc 12/01/13  Patient Location: Mother/Baby  Anesthesia Type:Spinal  Level of Consciousness: awake, alert , oriented and patient cooperative  Airway and Oxygen Therapy: Patient Spontanous Breathing  Post-op Pain: mild  Post-op Assessment: Post-op Vital signs reviewed, Patient's Cardiovascular Status Stable, Respiratory Function Stable, Patent Airway, No signs of Nausea or vomiting, Adequate PO intake and Pain level controlled  Post-op Vital Signs: Reviewed and stable  Last Vitals:  Filed Vitals:   11/29/13 1638  BP: 102/59  Pulse: 74  Temp:   Resp: 20    Complications: No apparent anesthesia complications

## 2013-11-29 NOTE — Op Note (Signed)
Cesarean Section Procedure Note  Pre-operative Diagnosis: IUP at 39 weeks, prev C/S for repeat  Post-operative Diagnosis: same  Surgeon: Luz Lex   Assistants: none  Anesthesia:spinal  Procedure:  Low Segment Transverse cesarean section  Procedure Details  The patient was seen in the Holding Room. The risks, benefits, complications, treatment options, and expected outcomes were discussed with the patient.  The patient concurred with the proposed plan, giving informed consent.  The site of surgery properly noted/marked.. A Time Out was held and the above information confirmed.  After induction of anesthesia, the patient was draped and prepped in the usual sterile manner. A Pfannenstiel incision was made and carried down through the subcutaneous tissue to the fascia. Fascial incision was made and extended transversely. The fascia was separated from the underlying rectus tissue superiorly and inferiorly. The peritoneum was identified and entered. Peritoneal incision was extended longitudinally. The utero-vesical peritoneal reflection was incised transversely and the bladder flap was bluntly freed from the lower uterine segment. A low transverse uterine incision was made. Delivered from vtx presentation was a baby with Apgar scores of 8 at one minute and 9 at five minutes. After the umbilical cord was clamped and cut cord blood was obtained for evaluation. The placenta was removed intact and appeared normal. The uterine outline, tubes and ovaries appeared normal. The uterine incision was closed with running locked sutures of 0 monocryl and imbricated with 0 monocryl. Hemostasis was observed. Lavage was carried out until clear. The peritoneum was then closed with 0 monocryl and rectus muscles plicated in the midline.  After hemostasis was assured, the fascia was then reapproximated with running sutures of 0 Vicryl. Irrigation was applied and after adequate hemostasis was assured, the skin was  reapproximated with staples.  Instrument, sponge, and needle counts were correct prior the abdominal closure and at the conclusion of the case. The patient received 2 grams cefotetan preoperatively.  Findings: Viable female  Estimated Blood Loss:  600cc         Specimens: Placenta was sent to L&D         Complications:  None

## 2013-11-29 NOTE — Addendum Note (Signed)
Addendum created 11/29/13 7680 by Rudean Curt, MD   Modules edited: Anesthesia Attestations

## 2013-11-29 NOTE — Anesthesia Preprocedure Evaluation (Signed)
Anesthesia Evaluation  Patient identified by MRN, date of birth, ID band Patient awake    Reviewed: Allergy & Precautions, H&P , Patient's Chart, lab work & pertinent test results, reviewed documented beta blocker date and time   Airway Mallampati: II TM Distance: >3 FB Neck ROM: full    Dental no notable dental hx.    Pulmonary  breath sounds clear to auscultation  Pulmonary exam normal       Cardiovascular Rhythm:regular Rate:Normal     Neuro/Psych    GI/Hepatic   Endo/Other    Renal/GU      Musculoskeletal   Abdominal   Peds  Hematology   Anesthesia Other Findings   Reproductive/Obstetrics                           Anesthesia Physical Anesthesia Plan  ASA: II  Anesthesia Plan: Spinal   Post-op Pain Management:    Induction:   Airway Management Planned:   Additional Equipment:   Intra-op Plan:   Post-operative Plan:   Informed Consent: I have reviewed the patients History and Physical, chart, labs and discussed the procedure including the risks, benefits and alternatives for the proposed anesthesia with the patient or authorized representative who has indicated his/her understanding and acceptance.   Dental Advisory Given  Plan Discussed with: CRNA  Anesthesia Plan Comments: (Lab work confirmed with CRNA in room. Platelets okay. Discussed spinal anesthetic, and patient consents to the procedure:  included risk of possible headache,backache, failed block, allergic reaction, and nerve injury. This patient was asked if she had any questions or concerns before the procedure started. )        Anesthesia Quick Evaluation

## 2013-11-30 ENCOUNTER — Encounter (HOSPITAL_COMMUNITY): Payer: Self-pay | Admitting: Obstetrics and Gynecology

## 2013-11-30 LAB — CBC
HCT: 30 % — ABNORMAL LOW (ref 36.0–46.0)
Hemoglobin: 10 g/dL — ABNORMAL LOW (ref 12.0–15.0)
MCH: 31.5 pg (ref 26.0–34.0)
MCHC: 33.3 g/dL (ref 30.0–36.0)
MCV: 94.6 fL (ref 78.0–100.0)
PLATELETS: 200 10*3/uL (ref 150–400)
RBC: 3.17 MIL/uL — AB (ref 3.87–5.11)
RDW: 13.3 % (ref 11.5–15.5)
WBC: 9.9 10*3/uL (ref 4.0–10.5)

## 2013-11-30 LAB — BIRTH TISSUE RECOVERY COLLECTION (PLACENTA DONATION)

## 2013-11-30 MED ORDER — RHO D IMMUNE GLOBULIN 1500 UNIT/2ML IJ SOSY
300.0000 ug | PREFILLED_SYRINGE | Freq: Once | INTRAMUSCULAR | Status: AC
  Administered 2013-11-30: 300 ug via INTRAVENOUS
  Filled 2013-11-30: qty 2

## 2013-11-30 NOTE — Progress Notes (Signed)
Subjective: Postpartum Day 1: Cesarean Delivery Patient reports tolerating PO, + flatus and no problems voiding.    Objective: Vital signs in last 24 hours: Temp:  [97.8 F (36.6 C)-98.9 F (37.2 C)] 98.2 F (36.8 C) (07/02 0507) Pulse Rate:  [59-77] 64 (07/02 0507) Resp:  [14-24] 18 (07/02 0507) BP: (92-120)/(59-72) 110/67 mmHg (07/02 0507) SpO2:  [95 %-99 %] 97 % (07/02 0507) Weight:  [181 lb (82.101 kg)] 181 lb (82.101 kg) (07/01 0932)  Physical Exam:  General: alert and cooperative Lochia: appropriate Uterine Fundus: firm Incision: honeycomb dressing noted with old drainage noted on bandage DVT Evaluation: No evidence of DVT seen on physical exam. Negative Homan's sign. No cords or calf tenderness. No significant calf/ankle edema.   Recent Labs  11/28/13 1200 11/30/13 0605  HGB 12.1 10.0*  HCT 35.6* 30.0*    Assessment/Plan: Status post Cesarean section. Doing well postoperatively.  Change honeycomb dressing after shower.  Thamar Holik G 11/30/2013, 7:59 AM

## 2013-12-01 LAB — RH IG WORKUP (INCLUDES ABO/RH)
ABO/RH(D): A NEG
FETAL SCREEN: NEGATIVE
GESTATIONAL AGE(WKS): 39.5
Unit division: 0

## 2013-12-01 MED ORDER — IBUPROFEN 600 MG PO TABS: 600.0000 mg | ORAL_TABLET | Freq: Four times a day (QID) | ORAL | Status: DC

## 2013-12-01 NOTE — Progress Notes (Signed)
Subjective: Postpartum Day 2: Cesarean Delivery Patient reports incisional pain, tolerating PO and no problems voiding.    Objective: Vital signs in last 24 hours: Temp:  [97.8 F (36.6 C)-97.9 F (36.6 C)] 97.8 F (36.6 C) (07/03 0545) Pulse Rate:  [61-68] 61 (07/03 0545) Resp:  [18-20] 18 (07/03 0545) BP: (108-121)/(52-75) 121/75 mmHg (07/03 0545) SpO2:  [97 %] 97 % (07/02 1314)  Physical Exam:  General: alert, cooperative and appears stated age Lochia: appropriate Uterine Fundus: firm Incision: healing well, no significant drainage, no dehiscence, no significant erythema DVT Evaluation: No evidence of DVT seen on physical exam.   Recent Labs  11/28/13 1200 11/30/13 0605  HGB 12.1 10.0*  HCT 35.6* 30.0*    Assessment/Plan: Status post Cesarean section. Doing well postoperatively.  Discharge home with standard precautions and return to clinic in 1.  Ajanae Virag L 12/01/2013, 8:00 AM

## 2013-12-01 NOTE — Discharge Summary (Signed)
Obstetric Discharge Summary Reason for Admission: cesarean section Prenatal Procedures: none Intrapartum Procedures: cesarean: low cervical, transverse Postpartum Procedures: none Complications-Operative and Postpartum: none Hemoglobin  Date Value Ref Range Status  11/30/2013 10.0* 12.0 - 15.0 g/dL Final     REPEATED TO VERIFY     DELTA CHECK NOTED     HCT  Date Value Ref Range Status  11/30/2013 30.0* 36.0 - 46.0 % Final    Physical Exam:  General: alert, cooperative and appears stated age 38: appropriate Uterine Fundus: firm Incision: healing well, no significant drainage, no dehiscence, no significant erythema DVT Evaluation: No evidence of DVT seen on physical exam.  Discharge Diagnoses: Term Pregnancy-delivered  Discharge Information: Date: 12/01/2013 Activity: pelvic rest Diet: routine Medications: Ibuprofen Condition: stable Instructions: refer to practice specific booklet Discharge to: home   Newborn Data: Live born female  Birth Weight: 7 lb 10.8 oz (3480 g) APGAR: 8, 9  Home with mother.  Robin Pafford L 12/01/2013, 8:01 AM

## 2013-12-02 LAB — TYPE AND SCREEN
ABO/RH(D): A NEG
Antibody Screen: POSITIVE
DAT, IGG: NEGATIVE
UNIT DIVISION: 0
UNIT DIVISION: 0

## 2014-04-02 ENCOUNTER — Encounter (HOSPITAL_COMMUNITY): Payer: Self-pay | Admitting: Obstetrics and Gynecology

## 2015-01-14 ENCOUNTER — Other Ambulatory Visit: Payer: Self-pay | Admitting: Obstetrics and Gynecology

## 2015-01-15 LAB — CYTOLOGY - PAP

## 2015-07-18 MED FILL — ESCITALOPRAM 10 MG TABLET: 10 | 30 days supply | Qty: 30 | Fill #0

## 2015-08-22 MED FILL — ESCITALOPRAM 10 MG TABLET: 10 | 30 days supply | Qty: 30 | Fill #1

## 2015-09-06 DIAGNOSIS — H52223 Regular astigmatism, bilateral: Secondary | ICD-10-CM | POA: Diagnosis not present

## 2015-10-02 MED FILL — ESCITALOPRAM 10 MG TABLET: 10 | 30 days supply | Qty: 30 | Fill #2

## 2015-10-03 MED FILL — RESTASIS MULTIDOSE 0.05% EY: 0.05 | 90 days supply | Qty: 17 | Fill #0

## 2015-11-12 MED FILL — ESCITALOPRAM 10 MG TABLET: 10 | 30 days supply | Qty: 30 | Fill #3

## 2016-01-31 MED FILL — ESCITALOPRAM 10 MG TABLET: 10 | 30 days supply | Qty: 30 | Fill #0

## 2016-02-14 DIAGNOSIS — Z6825 Body mass index (BMI) 25.0-25.9, adult: Secondary | ICD-10-CM | POA: Diagnosis not present

## 2016-02-14 DIAGNOSIS — Z01419 Encounter for gynecological examination (general) (routine) without abnormal findings: Secondary | ICD-10-CM | POA: Diagnosis not present

## 2016-02-14 MED FILL — ESCITALOPRAM 20 MG TABLET: 20 | 30 days supply | Qty: 30 | Fill #0

## 2016-02-14 MED FILL — BUPROPION HCL XL 150 MG TAB: 150 | 30 days supply | Qty: 30 | Fill #0

## 2016-03-25 MED FILL — ESCITALOPRAM 20 MG TABLET: 20 | 30 days supply | Qty: 30 | Fill #1

## 2016-03-25 MED FILL — BUPROPION HCL XL 300 MG TAB: 300 | 30 days supply | Qty: 30 | Fill #0

## 2016-04-01 ENCOUNTER — Emergency Department (HOSPITAL_COMMUNITY): Admission: EM | Admit: 2016-04-01 | Discharge: 2016-04-01 | Discharge status: Against Medical Advice | Payer: Self-pay

## 2016-04-30 MED FILL — BUPROPION HCL XL 300 MG TAB: 300 | 30 days supply | Qty: 30 | Fill #1

## 2016-04-30 MED FILL — ESCITALOPRAM 20 MG TABLET: 20 | 30 days supply | Qty: 30 | Fill #2

## 2016-06-12 MED FILL — BUPROPION HCL XL 300 MG TAB: 300 | 30 days supply | Qty: 30 | Fill #2

## 2016-06-12 MED FILL — ESCITALOPRAM 20 MG TABLET: 20 | 30 days supply | Qty: 30 | Fill #3

## 2016-08-03 MED FILL — BUPROPION HCL XL 300 MG TAB: 300 | 30 days supply | Qty: 30 | Fill #3

## 2016-08-03 MED FILL — ESCITALOPRAM 20 MG TABLET: 20 | 30 days supply | Qty: 30 | Fill #4

## 2016-08-09 ENCOUNTER — Other Ambulatory Visit: Payer: Self-pay | Admitting: Neurology

## 2016-09-10 DIAGNOSIS — R6882 Decreased libido: Secondary | ICD-10-CM | POA: Diagnosis not present

## 2016-09-10 DIAGNOSIS — Z780 Asymptomatic menopausal state: Secondary | ICD-10-CM | POA: Diagnosis not present

## 2016-09-10 DIAGNOSIS — F411 Generalized anxiety disorder: Secondary | ICD-10-CM | POA: Diagnosis not present

## 2016-09-10 DIAGNOSIS — N951 Menopausal and female climacteric states: Secondary | ICD-10-CM | POA: Diagnosis not present

## 2016-09-14 MED FILL — BUPROPION HCL XL 300 MG TAB: 300 | 30 days supply | Qty: 30 | Fill #4

## 2016-09-14 MED FILL — ESCITALOPRAM 20 MG TABLET: 20 | 30 days supply | Qty: 30 | Fill #5

## 2016-09-17 DIAGNOSIS — R6882 Decreased libido: Secondary | ICD-10-CM | POA: Diagnosis not present

## 2016-10-15 ENCOUNTER — Ambulatory Visit (HOSPITAL_COMMUNITY)
Admission: EM | Admit: 2016-10-15 | Discharge: 2016-10-15 | Disposition: A | Discharge status: Home / Self Care | Payer: 59 | Attending: Internal Medicine | Admitting: Internal Medicine

## 2016-10-15 ENCOUNTER — Encounter (HOSPITAL_COMMUNITY): Payer: Self-pay | Admitting: Emergency Medicine

## 2016-10-15 DIAGNOSIS — J020 Streptococcal pharyngitis: Secondary | ICD-10-CM | POA: Diagnosis not present

## 2016-10-15 DIAGNOSIS — R6882 Decreased libido: Secondary | ICD-10-CM | POA: Diagnosis not present

## 2016-10-15 LAB — POCT RAPID STREP A: Streptococcus, Group A Screen (Direct): POSITIVE — AB

## 2016-10-15 MED ORDER — LIDOCAINE VISCOUS 2 % MT SOLN: OROMUCOSAL | 0 refills | Status: DC

## 2016-10-15 MED ORDER — AMOXICILLIN 875 MG PO TABS: 875.0000 mg | ORAL_TABLET | Freq: Two times a day (BID) | ORAL | 0 refills | Status: DC

## 2016-10-15 MED FILL — LIDOCAINE 2% VISCOUS SOLN: 2 | 2 days supply | Qty: 100 | Fill #0

## 2016-10-15 MED FILL — AMOXICILLIN 875 MG TABLET: 875 | 7 days supply | Qty: 14 | Fill #0

## 2016-10-15 NOTE — ED Provider Notes (Signed)
CSN: [id number]     Arrival date & time 10/15/16  1002 History   None    Chief Complaint  Patient presents with  . Sore Throat   (Consider location/radiation/quality/duration/timing/severity/associated sxs/prior Treatment) Patient c/o sore throat and URI sx's.   The history is provided by the patient.  Sore Throat  This is a new problem. The problem occurs constantly. The problem has not changed since onset.Nothing aggravates the symptoms. Nothing relieves the symptoms. She has tried nothing for the symptoms.    Past Medical History:  Diagnosis Date  . Abnormal Pap smear   . No pertinent past medical history    Past Surgical History:  Procedure Laterality Date  . BREAST LUMPECTOMY    . CESAREAN SECTION  04/13/2012   Procedure: CESAREAN SECTION;  Surgeon: Cyril Mourning, MD;  Location: [facility];  Service: Obstetrics;  Laterality: N/A;  . CESAREAN SECTION N/A 11/29/2013   Procedure: CESAREAN SECTION;  Surgeon: Luz Lex, MD;  Location: [facility];  Service: Obstetrics;  Laterality: N/A;  Repeat edc 12/01/13   Family History  Problem Relation Age of Onset  . Other Neg Hx    Social History  Substance Use Topics  . Smoking status: Never Smoker  . Smokeless tobacco: Not on file  . Alcohol use No   OB History    Gravida Para Term Preterm AB Living   3 2 2  0 1 2   SAB TAB Ectopic Multiple Live Births   0 0 0 0 2     Review of Systems  Constitutional: Positive for fatigue.  HENT: Positive for sore throat.   Eyes: Negative.   Respiratory: Negative.   Cardiovascular: Negative.   Gastrointestinal: Negative.   Endocrine: Negative.   Genitourinary: Negative.   Musculoskeletal: Negative.   Skin: Negative.   Allergic/Immunologic: Negative.   Neurological: Negative.   Hematological: Negative.   Psychiatric/Behavioral: Negative.     Allergies  Codeine and Hydromorphone  Home Medications   Prior to Admission medications   Medication Sig Start Date End Date Taking?  Authorizing Provider  buPROPion (WELLBUTRIN) 100 MG tablet Take 100 mg by mouth 2 (two) times daily.   Yes [provider]  escitalopram (LEXAPRO) 10 MG tablet Take 10 mg by mouth daily.   Yes [provider]  ibuprofen (ADVIL,MOTRIN) 600 MG tablet Take 1 tablet (600 mg total) by mouth every 6 (six) hours. 12/01/13  Yes Dian Queen, MD  amoxicillin (AMOXIL) 875 MG tablet Take 1 tablet (875 mg total) by mouth 2 (two) times daily. 10/15/16   Lysbeth Penner, FNP  lidocaine (XYLOCAINE) 2 % solution Take 10 ml po q 4 hours prn throat pain 10/15/16   Lysbeth Penner, FNP  Prenatal Vit-Fe Fumarate-FA (PRENATAL MULTIVITAMIN) TABS Take 1 tablet by mouth daily.    [provider]   Meds Ordered and Administered this Visit  Medications - No data to display  BP 121/73 (BP Location: Left Arm)   Pulse 70   Temp 98.4 F (36.9 C) (Oral)   Resp 18   SpO2 100%  No data found.   Physical Exam  Constitutional: She is oriented to person, place, and time. She appears well-developed and well-nourished.  HENT:  Head: Normocephalic and atraumatic.  Right Ear: External ear normal.  Left Ear: External ear normal.  opx erythematous and tonsils 2 plus with exudates.  Eyes: Conjunctivae and EOM are normal. Pupils are equal, round, and reactive to light.  Neck: Normal range of motion.  Neck supple.  Cardiovascular: Normal rate, regular rhythm and normal heart sounds.   Pulmonary/Chest: Effort normal and breath sounds normal.  Abdominal: Soft. Bowel sounds are normal.  Lymphadenopathy:    She has cervical adenopathy.  Neurological: She is alert and oriented to person, place, and time.  Nursing note and vitals reviewed.   Urgent Care Course     Procedures (including critical care time)  Labs Review Labs Reviewed  POCT RAPID STREP A - Abnormal; Notable for the following:       Result Value   Streptococcus, Group A Screen (Direct) POSITIVE (*)    All other components  within normal limits    Imaging Review No results found.   Visual Acuity Review  Right Eye Distance:   Left Eye Distance:   Bilateral Distance:    Right Eye Near:   Left Eye Near:    Bilateral Near:         MDM   1. Strep throat    Amoxicillin 875mg  one po bid x 7 days #14 Lidocaine viscous 54ml po q 4 hours prn #136ml  Push po fluids, rest, tylenol and motrin otc prn as directed for fever, arthralgias, and myalgias.  Follow up prn if sx's continue or persist.    Lysbeth Penner, FNP 10/15/16 1140

## 2016-10-15 NOTE — Medical Student Note (Signed)
[facility] Statistician Note For educational purposes for Medical, PA and NP students only and not part of the legal medical record.   CSN: [id number] Arrival date & time: 10/15/16  1002     History   Chief Complaint Chief Complaint  Patient presents with  . Sore Throat    HPI Heather Powell is a 41 y.o. female.  Pt is a 41 year old female that present to the clinic today with fever and sore throat since last night. Denies any cough, congestion.    The history is provided by the patient.  Sore Throat  This is a new problem. The current episode started yesterday. The problem occurs constantly. The problem has not changed since onset.Pertinent negatives include no chest pain, no abdominal pain, no headaches and no shortness of breath. Nothing aggravates the symptoms. The symptoms are relieved by acetaminophen. She has tried acetaminophen for the symptoms. The treatment provided mild relief.    Past Medical History:  Diagnosis Date  . Abnormal Pap smear   . No pertinent past medical history     Patient Active Problem List   Diagnosis Date Noted  . Cesarean delivery delivered 11/29/2013    Past Surgical History:  Procedure Laterality Date  . BREAST LUMPECTOMY    . CESAREAN SECTION  04/13/2012   Procedure: CESAREAN SECTION;  Surgeon: Cyril Mourning, MD;  Location: [facility];  Service: Obstetrics;  Laterality: N/A;  . CESAREAN SECTION N/A 11/29/2013   Procedure: CESAREAN SECTION;  Surgeon: Luz Lex, MD;  Location: [facility];  Service: Obstetrics;  Laterality: N/A;  Repeat edc 12/01/13    OB History    Gravida Para Term Preterm AB Living   3 2 2  0 1 2   SAB TAB Ectopic Multiple Live Births   0 0 0 0 2       Home Medications    Prior to Admission medications   Medication Sig Start Date End Date Taking? Authorizing Provider  buPROPion (WELLBUTRIN) 100 MG tablet Take 100 mg by mouth 2 (two) times daily.   Yes [provider]  escitalopram  (LEXAPRO) 10 MG tablet Take 10 mg by mouth daily.   Yes [provider]  ibuprofen (ADVIL,MOTRIN) 600 MG tablet Take 1 tablet (600 mg total) by mouth every 6 (six) hours. 12/01/13  Yes Dian Queen, MD  amoxicillin (AMOXIL) 875 MG tablet Take 1 tablet (875 mg total) by mouth 2 (two) times daily. 10/15/16   Lysbeth Penner, FNP  lidocaine (XYLOCAINE) 2 % solution Take 10 ml po q 4 hours prn throat pain 10/15/16   Lysbeth Penner, FNP  Prenatal Vit-Fe Fumarate-FA (PRENATAL MULTIVITAMIN) TABS Take 1 tablet by mouth daily.    [provider]    Family History Family History  Problem Relation Age of Onset  . Other Neg Hx     Social History Social History  Substance Use Topics  . Smoking status: Never Smoker  . Smokeless tobacco: Not on file  . Alcohol use No     Allergies   Codeine and Hydromorphone   Review of Systems Review of Systems  Constitutional: Positive for fever. Negative for activity change and appetite change.  HENT: Positive for sore throat. Negative for congestion, ear pain and rhinorrhea.   Eyes: Negative.   Respiratory: Negative for cough and shortness of breath.   Cardiovascular: Negative for chest pain.  Gastrointestinal: Negative for abdominal pain.  Endocrine: Negative.   Genitourinary: Negative.  Musculoskeletal: Negative.   Skin: Negative.   Allergic/Immunologic: Negative.   Neurological: Negative for headaches.  Hematological: Negative.   Psychiatric/Behavioral: Negative.      Physical Exam Updated Vital Signs BP 121/73 (BP Location: Left Arm)   Pulse 70   Temp 98.4 F (36.9 C) (Oral)   Resp 18   SpO2 100%   Physical Exam  Constitutional: She appears well-developed and well-nourished. No distress.  HENT:  Head: Normocephalic and atraumatic.  Right Ear: External ear normal.  Left Ear: External ear normal.  Nose: Nose normal.  Pt tonsils 2+ with exudate and erythema present.   Skin: Skin is warm and dry. Capillary  refill takes less than 2 seconds.  Psychiatric: She has a normal mood and affect.     ED Treatments / Results  Labs (all labs ordered are listed, but only abnormal results are displayed) Labs Reviewed  POCT RAPID STREP A - Abnormal; Notable for the following:       Result Value   Streptococcus, Group A Screen (Direct) POSITIVE (*)    All other components within normal limits    EKG  EKG Interpretation None       Radiology No results found.  Procedures Procedures (including critical care time)  Medications Ordered in ED Medications - No data to display   Initial Impression / Assessment and Plan / ED Course  I have reviewed the triage vital signs and the nursing notes.  Pertinent labs & imaging results that were available during my care of the patient were reviewed by me and considered in my medical decision making (see chart for details).       Final Clinical Impressions(s) / ED Diagnoses   Final diagnoses:  Strep throat    New Prescriptions New Prescriptions   AMOXICILLIN (AMOXIL) 875 MG TABLET    Take 1 tablet (875 mg total) by mouth 2 (two) times daily.   LIDOCAINE (XYLOCAINE) 2 % SOLUTION    Take 10 ml po q 4 hours prn throat pain

## 2016-10-15 NOTE — ED Triage Notes (Signed)
For 3 days patient reports a 102 temp in the evening.  No fever until evening.  Has a sore throat constantly.  Minimal drainage in throat

## 2016-10-20 MED FILL — ESCITALOPRAM 20 MG TABLET: 20 | 30 days supply | Qty: 30 | Fill #6

## 2016-10-20 MED FILL — BUPROPION HCL XL 300 MG TAB: 300 | 30 days supply | Qty: 30 | Fill #5

## 2016-11-19 DIAGNOSIS — R6882 Decreased libido: Secondary | ICD-10-CM | POA: Diagnosis not present

## 2016-11-27 MED FILL — BUPROPION HCL XL 300 MG TAB: 300 | 30 days supply | Qty: 30 | Fill #6

## 2016-11-27 MED FILL — ESCITALOPRAM 20 MG TABLET: 20 | 30 days supply | Qty: 30 | Fill #7

## 2016-12-08 DIAGNOSIS — E785 Hyperlipidemia, unspecified: Secondary | ICD-10-CM | POA: Insufficient documentation

## 2016-12-08 DIAGNOSIS — Z0389 Encounter for observation for other suspected diseases and conditions ruled out: Secondary | ICD-10-CM | POA: Diagnosis not present

## 2016-12-08 DIAGNOSIS — R5383 Other fatigue: Secondary | ICD-10-CM | POA: Diagnosis not present

## 2016-12-08 DIAGNOSIS — Z0000 Encounter for general adult medical examination without abnormal findings: Secondary | ICD-10-CM | POA: Diagnosis not present

## 2016-12-08 DIAGNOSIS — F331 Major depressive disorder, recurrent, moderate: Secondary | ICD-10-CM | POA: Diagnosis not present

## 2016-12-08 DIAGNOSIS — Z13220 Encounter for screening for lipoid disorders: Secondary | ICD-10-CM | POA: Diagnosis not present

## 2016-12-08 DIAGNOSIS — E2839 Other primary ovarian failure: Secondary | ICD-10-CM | POA: Diagnosis not present

## 2016-12-08 DIAGNOSIS — E559 Vitamin D deficiency, unspecified: Secondary | ICD-10-CM | POA: Diagnosis not present

## 2016-12-08 DIAGNOSIS — R748 Abnormal levels of other serum enzymes: Secondary | ICD-10-CM | POA: Insufficient documentation

## 2016-12-16 DIAGNOSIS — E785 Hyperlipidemia, unspecified: Secondary | ICD-10-CM | POA: Diagnosis not present

## 2016-12-16 DIAGNOSIS — E2839 Other primary ovarian failure: Secondary | ICD-10-CM | POA: Diagnosis not present

## 2016-12-16 DIAGNOSIS — E559 Vitamin D deficiency, unspecified: Secondary | ICD-10-CM | POA: Diagnosis not present

## 2016-12-16 DIAGNOSIS — R945 Abnormal results of liver function studies: Secondary | ICD-10-CM | POA: Diagnosis not present

## 2017-01-01 MED FILL — ESCITALOPRAM 20 MG TABLET: 20 | 30 days supply | Qty: 30 | Fill #8

## 2017-01-01 MED FILL — buPROPion HCL ER (XL) 300 M: 300 | 30 days supply | Qty: 30 | Fill #7

## 2017-01-07 DIAGNOSIS — E2839 Other primary ovarian failure: Secondary | ICD-10-CM | POA: Diagnosis not present

## 2017-01-07 DIAGNOSIS — R945 Abnormal results of liver function studies: Secondary | ICD-10-CM | POA: Diagnosis not present

## 2017-01-27 DIAGNOSIS — E2839 Other primary ovarian failure: Secondary | ICD-10-CM | POA: Diagnosis not present

## 2017-02-08 MED FILL — BUPROPION HCL XL 300 MG TAB: 300 | 30 days supply | Qty: 30 | Fill #8

## 2017-02-08 MED FILL — ESCITALOPRAM 20 MG TABLET: 20 | 30 days supply | Qty: 30 | Fill #9

## 2017-03-26 MED FILL — ESCITALOPRAM 20 MG TABLET: 20 | 30 days supply | Qty: 30 | Fill #0

## 2017-03-26 MED FILL — BUPROPION HCL XL 300 MG TAB: 300 | 30 days supply | Qty: 30 | Fill #0

## 2017-03-29 DIAGNOSIS — E559 Vitamin D deficiency, unspecified: Secondary | ICD-10-CM | POA: Diagnosis not present

## 2017-03-29 DIAGNOSIS — E2839 Other primary ovarian failure: Secondary | ICD-10-CM | POA: Diagnosis not present

## 2017-04-01 DIAGNOSIS — E2839 Other primary ovarian failure: Secondary | ICD-10-CM | POA: Diagnosis not present

## 2017-04-06 DIAGNOSIS — B079 Viral wart, unspecified: Secondary | ICD-10-CM | POA: Diagnosis not present

## 2017-04-09 DIAGNOSIS — H52223 Regular astigmatism, bilateral: Secondary | ICD-10-CM | POA: Diagnosis not present

## 2017-04-09 DIAGNOSIS — H5203 Hypermetropia, bilateral: Secondary | ICD-10-CM | POA: Diagnosis not present

## 2017-04-29 MED FILL — ESCITALOPRAM 20 MG TABLET: 20 | 90 days supply | Qty: 90 | Fill #0

## 2017-04-29 MED FILL — BUPROPION HCL XL 300 MG TAB: 300 | 90 days supply | Qty: 90 | Fill #0

## 2017-08-06 LAB — HM PAP SMEAR: HM Pap smear: NEGATIVE

## 2017-09-15 MED FILL — ESCITALOPRAM 20 MG TABLET: 20 | 90 days supply | Qty: 90 | Fill #0

## 2017-09-15 MED FILL — BUPROPION HCL XL 300 MG TAB: 300 | 90 days supply | Qty: 90 | Fill #0

## 2017-11-02 ENCOUNTER — Telehealth: Payer: Self-pay | Admitting: Family Medicine

## 2017-11-02 NOTE — Telephone Encounter (Signed)
Patient left voice message that she was returning a call, I left her a detailed message letting her know Dr.Hagler would no longer be in our office after sept.

## 2017-12-10 ENCOUNTER — Ambulatory Visit: Payer: Self-pay | Admitting: Family Medicine

## 2017-12-24 ENCOUNTER — Other Ambulatory Visit: Payer: Self-pay

## 2017-12-24 ENCOUNTER — Encounter: Payer: Self-pay | Admitting: Family Medicine

## 2017-12-24 ENCOUNTER — Ambulatory Visit (INDEPENDENT_AMBULATORY_CARE_PROVIDER_SITE_OTHER): Payer: No Typology Code available for payment source | Admitting: Family Medicine

## 2017-12-24 VITALS — BP 100/58 | HR 70 | Temp 98.1°F | Resp 16 | Ht 65.5 in | Wt 152.0 lb

## 2017-12-24 DIAGNOSIS — E785 Hyperlipidemia, unspecified: Secondary | ICD-10-CM | POA: Diagnosis not present

## 2017-12-24 DIAGNOSIS — E782 Mixed hyperlipidemia: Secondary | ICD-10-CM | POA: Diagnosis not present

## 2017-12-24 DIAGNOSIS — F329 Major depressive disorder, single episode, unspecified: Secondary | ICD-10-CM

## 2017-12-24 DIAGNOSIS — Z0000 Encounter for general adult medical examination without abnormal findings: Secondary | ICD-10-CM

## 2017-12-24 DIAGNOSIS — R748 Abnormal levels of other serum enzymes: Secondary | ICD-10-CM

## 2017-12-24 DIAGNOSIS — F32A Depression, unspecified: Secondary | ICD-10-CM

## 2017-12-24 LAB — COMPLETE METABOLIC PANEL WITH GFR
AG RATIO: 1.6 (calc) (ref 1.0–2.5)
ALBUMIN MSPROF: 4.4 g/dL (ref 3.6–5.1)
ALKALINE PHOSPHATASE (APISO): 48 U/L (ref 33–115)
ALT: 13 U/L (ref 6–29)
AST: 16 U/L (ref 10–30)
BILIRUBIN TOTAL: 0.8 mg/dL (ref 0.2–1.2)
BUN: 17 mg/dL (ref 7–25)
CHLORIDE: 105 mmol/L (ref 98–110)
CO2: 26 mmol/L (ref 20–32)
Calcium: 9.5 mg/dL (ref 8.6–10.2)
Creat: 0.84 mg/dL (ref 0.50–1.10)
GFR, Est African American: 101 mL/min/{1.73_m2} (ref >=60)
GFR, Est Non African American: 87 mL/min/{1.73_m2} (ref >=60)
GLOBULIN: 2.7 g/dL (ref 1.9–3.7)
Glucose, Bld: 81 mg/dL (ref 65–99)
POTASSIUM: 4.4 mmol/L (ref 3.5–5.3)
SODIUM: 138 mmol/L (ref 135–146)
Total Protein: 7.1 g/dL (ref 6.1–8.1)

## 2017-12-24 LAB — CBC WITH DIFFERENTIAL/PLATELET
Basophils Absolute: 18 cells/uL (ref 0–200)
Basophils Relative: 0.3 %
EOS ABS: 61 {cells}/uL (ref 15–500)
Eosinophils Relative: 1 %
HCT: 38.8 % (ref 35.0–45.0)
Hemoglobin: 13.3 g/dL (ref 11.7–15.5)
Lymphs Abs: 1647 cells/uL (ref 850–3900)
MCH: 32.3 pg (ref 27.0–33.0)
MCHC: 34.3 g/dL (ref 32.0–36.0)
MCV: 94.2 fL (ref 80.0–100.0)
MONOS PCT: 7.7 %
MPV: 9.4 fL (ref 7.5–12.5)
Neutro Abs: 3904 cells/uL (ref 1500–7800)
Neutrophils Relative %: 64 %
Platelets: 314 10*3/uL (ref 140–400)
RBC: 4.12 10*6/uL (ref 3.80–5.10)
RDW: 11.9 % (ref 11.0–15.0)
Total Lymphocyte: 27 %
WBC: 6.1 10*3/uL (ref 3.8–10.8)
WBCMIX: 470 {cells}/uL (ref 200–950)

## 2017-12-24 LAB — LIPID PANEL
Cholesterol: 155 mg/dL (ref <200)
HDL: 44 mg/dL — ABNORMAL LOW (ref >50)
LDL Cholesterol (Calc): 99 mg/dL (calc)
Non-HDL Cholesterol (Calc): 111 mg/dL (calc) (ref <130)
Total CHOL/HDL Ratio: 3.5 (calc) (ref <5.0)
Triglycerides: 42 mg/dL (ref <150)

## 2017-12-24 NOTE — Progress Notes (Deleted)
Patient ID: Heather Powell, female    DOB: Oct 07, 1975, 42 y.o.   MRN: [id number]  PCP: Delsa Grana, PA-C  Chief Complaint  Patient presents with  . New Patient (Initial Visit)    Subjective:   Heather Powell is a 42 y.o. female, presents to clinic with CC of headaches and hx of anxiety and depression, was on lexapro for 10 years and two months ago slowly weaned it off but when she completely discontinued it    Patient Active Problem List   Diagnosis Date Noted  . Cesarean delivery delivered 11/29/2013     Prior to Admission medications   Medication Sig Start Date End Date Taking? Authorizing Provider  CANNABIDIOL PO Take 500 mg by mouth. 10 drops in the morning, 10 drops night   Yes [provider]  escitalopram (LEXAPRO) 10 MG tablet Take 10 mg by mouth daily. Patient taking 1/2 tablet every 3 days   Yes [provider]  buPROPion (WELLBUTRIN) 100 MG tablet Take 100 mg by mouth 2 (two) times daily.    [provider]     Allergies  Allergen Reactions  . Codeine Nausea And Vomiting  . Hydromorphone Nausea And Vomiting     Family History  Problem Relation Age of Onset  . Other Neg Hx      Social History   Socioeconomic History  . Marital status: Married    Spouse name: Not on file  . Number of children: Not on file  . Years of education: Not on file  . Highest education level: Not on file  Occupational History  . Not on file  Social Needs  . Financial resource strain: Not on file  . Food insecurity:    Worry: Not on file    Inability: Not on file  . Transportation needs:    Medical: Not on file    Non-medical: Not on file  Tobacco Use  . Smoking status: Never Smoker  . Smokeless tobacco: Never Used  Substance and Sexual Activity  . Alcohol use: No  . Drug use: No  . Sexual activity: Yes  Lifestyle  . Physical activity:    Days per week: Not on file    Minutes per session: Not on file  . Stress: Not on file    Relationships  . Social connections:    Talks on phone: Not on file    Gets together: Not on file    Attends religious service: Not on file    Active member of club or organization: Not on file    Attends meetings of clubs or organizations: Not on file    Relationship status: Not on file  . Intimate partner violence:    Fear of current or ex partner: Not on file    Emotionally abused: Not on file    Physically abused: Not on file    Forced sexual activity: Not on file  Other Topics Concern  . Not on file  Social History Narrative  . Not on file     Review of Systems     Objective:    Vitals:   12/24/17 0945  BP: (!) 100/58  Pulse: 70  Resp: 16  Temp: 98.1 F (36.7 C)  TempSrc: Oral  SpO2: 98%  Weight: 152 lb (68.9 kg)  Height: 5' 5.5" (1.664 m)      Physical Exam        Assessment & Plan:    No diagnosis found.  Delsa Grana, PA-C 12/24/17 10:02 AM

## 2017-12-24 NOTE — Progress Notes (Signed)
Patient: Heather Powell, Female    DOB: 1975/06/29, 42 y.o.   MRN: [id number] Visit Date: 12/28/2017  Today's Provider: Delsa Grana, PA-C   Chief Complaint  Patient presents with  . New Patient (Initial Visit)   Subjective:    Annual physical exam Heather Powell is a 42 y.o. female who presents today for health maintenance and complete physical. She feels well. She reports exercising several times a week runs 6 miles. She reports she is sleeping well.  ----------------------------------------------------------------- She is new to establish care - brings with her lab work from last year.  She is due to PAP next month, manages with her OBGYN  She has PMHx of elevated LFT's last year, was not repeated and did not have any additional tests.  Hx of anxiety and depression, previously on lexapro and wellbutrin but over the past several months has weaned herself off both meds.  Complete d/c of lexapro did give her moderate recurring headaches and so she began taking half of the Lexapro every 3 days and this is seem to prevent any further headaches.  She is also taking CBD oils and supplements which seem to help with her sleeping and anxiety.  Nobody is currently managing her medicines and she is not under any active therapy  She is concerned with a strong family history of hyperlipidemia and is fasting today.  Review of Systems  Constitutional: Negative.   HENT: Negative.   Eyes: Negative.   Respiratory: Negative.   Cardiovascular: Negative.   Gastrointestinal: Negative.   Endocrine: Negative.   Genitourinary: Negative.   Musculoskeletal: Negative.   Skin: Negative.   Allergic/Immunologic: Negative.   Neurological: Negative.   Hematological: Negative.   Psychiatric/Behavioral: Negative.     Social History      She  reports that she has never smoked. She has never used smokeless tobacco. She reports that she does not drink alcohol or use drugs.       Social History    Socioeconomic History  . Marital status: Married    Spouse name: Not on file  . Number of children: Not on file  . Years of education: Not on file  . Highest education level: Not on file  Occupational History  . Not on file  Social Needs  . Financial resource strain: Not on file  . Food insecurity:    Worry: Not on file    Inability: Not on file  . Transportation needs:    Medical: Not on file    Non-medical: Not on file  Tobacco Use  . Smoking status: Never Smoker  . Smokeless tobacco: Never Used  Substance and Sexual Activity  . Alcohol use: No    Frequency: Never  . Drug use: No  . Sexual activity: Yes  Lifestyle  . Physical activity:    Days per week: 3 days    Minutes per session: 60 min  . Stress: Not on file  Relationships  . Social connections:    Talks on phone: Not on file    Gets together: Not on file    Attends religious service: Not on file    Active member of club or organization: Not on file    Attends meetings of clubs or organizations: Not on file    Relationship status: Not on file  Other Topics Concern  . Not on file  Social History Narrative  . Not on file    Past Medical History:  Diagnosis Date  . Abnormal Pap  smear   . Anxiety   . Depression      Patient Active Problem List   Diagnosis Date Noted  . Depression 12/28/2017  . Hyperlipidemia 12/08/2016  . Abnormal liver enzymes 12/08/2016  . Cesarean delivery delivered 11/29/2013    Past Surgical History:  Procedure Laterality Date  . BREAST LUMPECTOMY     [place] 2009 benign breast lump  . CESAREAN SECTION  04/13/2012   Procedure: CESAREAN SECTION;  Surgeon: Cyril Mourning, MD;  Location: [facility];  Service: Obstetrics;  Laterality: N/A;  . CESAREAN SECTION N/A 11/29/2013   Procedure: CESAREAN SECTION;  Surgeon: Luz Lex, MD;  Location: [facility];  Service: Obstetrics;  Laterality: N/A;  Repeat edc 12/01/13    Family History        Family Status  Relation Name Status  .  Mother  Alive  . Father  Alive  . Brother  Alive        Her family history includes Heart disease in her father; Hyperlipidemia in her mother; Hypertension in her brother and father; Stroke in her father.      Allergies  Allergen Reactions  . Codeine Nausea And Vomiting  . Hydromorphone Nausea And Vomiting     Current Outpatient Medications:  .  CANNABIDIOL PO, Take 500 mg by mouth. 10 drops in the morning, 10 drops night, Disp: , Rfl:  .  escitalopram (LEXAPRO) 10 MG tablet, Take 10 mg by mouth daily. Patient taking 1/2 tablet every 3 days, Disp: , Rfl:  .  buPROPion (WELLBUTRIN) 100 MG tablet, Take 100 mg by mouth 2 (two) times daily., Disp: , Rfl:    Patient Care Team: Delsa Grana, PA-C as PCP - General (Family Medicine)      Objective:   Vitals: BP (!) 100/58   Pulse 70   Temp 98.1 F (36.7 C) (Oral)   Resp 16   Ht 5' 5.5" (1.664 m)   Wt 152 lb (68.9 kg)   SpO2 98%   BMI 24.91 kg/m    Vitals:   12/24/17 0945  BP: (!) 100/58  Pulse: 70  Resp: 16  Temp: 98.1 F (36.7 C)  TempSrc: Oral  SpO2: 98%  Weight: 152 lb (68.9 kg)  Height: 5' 5.5" (1.664 m)     Physical Exam  Constitutional: She is oriented to person, place, and time. She appears well-developed and well-nourished.  Non-toxic appearance. No distress.  HENT:  Head: Normocephalic and atraumatic.  Right Ear: External ear normal.  Left Ear: External ear normal.  Nose: Nose normal.  Mouth/Throat: Uvula is midline, oropharynx is clear and moist and mucous membranes are normal.  Eyes: Pupils are equal, round, and reactive to light. Conjunctivae, EOM and lids are normal.  Neck: Normal range of motion and phonation normal. Neck supple. No tracheal deviation present. No thyromegaly present.  Cardiovascular: Normal rate, regular rhythm, normal heart sounds and normal pulses. Exam reveals no gallop and no friction rub.  No murmur heard. Pulses:      Radial pulses are 2+ on the right side, and 2+ on the left  side.       Posterior tibial pulses are 2+ on the right side, and 2+ on the left side.  Pulmonary/Chest: Effort normal and breath sounds normal. No stridor. No respiratory distress. She has no wheezes. She has no rhonchi. She has no rales. She exhibits no tenderness.  Abdominal: Soft. Normal appearance and bowel sounds are normal. She exhibits no distension. There is no tenderness.  There is no rebound and no guarding.  Musculoskeletal: Normal range of motion. She exhibits no edema or deformity.  Lymphadenopathy:    She has no cervical adenopathy.  Neurological: She is alert and oriented to person, place, and time. She exhibits normal muscle tone. Coordination and gait normal.  Skin: Skin is warm, dry and intact. Capillary refill takes less than 2 seconds. No rash noted. She is not diaphoretic. No pallor.  Psychiatric: She has a normal mood and affect. Her speech is normal and behavior is normal.  Nursing note and vitals reviewed.    Depression Screen PHQ 2/9 Scores 12/24/2017  PHQ - 2 Score 0     Office Visit from 12/24/2017 in [place]  AUDIT-C Score  0        Assessment & Plan:     Routine Health Maintenance and Physical Exam  Exercise Activities and Dietary recommendations Goals    None    work on other forms of exercise, limit high fat/fried foods  Immunization History  Administered Date(s) Administered  . MMR 04/16/2012  . PPD Test 11/22/2012  . Rho (D) Immune Globulin 04/14/2012    Health Maintenance  Topic Date Due  . TETANUS/TDAP  12/29/2018 (Originally 09/19/1996)  . INFLUENZA VACCINE  12/30/2017  . PAP SMEAR  01/13/2018  . HIV Screening  Completed     Discussed health benefits of physical activity, and encouraged her to engage in regular exercise appropriate for her age and condition.    -------------------------------------------------------------------- We will obtain basic screening labs and fasting lipid panel.  She wishes to do  Tdap another time.    Will to mammogram and PAP with OBGYN - Dr. Corinna Capra  Problem List Items Addressed This Visit      Other   Depression    diong well on current tx which she has tapered herself.  con't lexapro 5 mg every 3 days PHQ 2 - 0 No HI, SI, AVH Wants to get off meds completely      Hyperlipidemia    Repeat FLP      Abnormal liver enzymes    Repeat CMP       Other Visit Diagnoses    General medical exam    -  Primary   Relevant Orders   CBC with Differential/Platelet (Completed)   COMPLETE METABOLIC PANEL WITH GFR (Completed)   Dyslipidemia (high LDL; low HDL)       Relevant Orders   Lipid panel (Completed)        Delsa Grana, PA-C 12/28/17 5:34 PM  [facility]

## 2017-12-24 NOTE — Patient Instructions (Signed)

## 2017-12-27 NOTE — Progress Notes (Signed)
Please notify pt of results  Labs look great.  HDL is almost at goal - it's 44, goal >50.

## 2017-12-28 ENCOUNTER — Encounter: Payer: Self-pay | Admitting: Family Medicine

## 2017-12-28 DIAGNOSIS — F32A Depression, unspecified: Secondary | ICD-10-CM | POA: Insufficient documentation

## 2017-12-28 DIAGNOSIS — F329 Major depressive disorder, single episode, unspecified: Secondary | ICD-10-CM | POA: Insufficient documentation

## 2017-12-28 NOTE — Assessment & Plan Note (Signed)
Repeat CMP

## 2017-12-28 NOTE — Assessment & Plan Note (Signed)
Repeat FLP

## 2017-12-28 NOTE — Assessment & Plan Note (Signed)
diong well on current tx which she has tapered herself.  con't lexapro 5 mg every 3 days PHQ 2 - 0 No HI, SI, AVH Wants to get off meds completely

## 2017-12-30 ENCOUNTER — Encounter: Payer: Self-pay | Admitting: *Deleted

## 2017-12-30 DIAGNOSIS — E559 Vitamin D deficiency, unspecified: Secondary | ICD-10-CM | POA: Insufficient documentation

## 2017-12-30 DIAGNOSIS — E2839 Other primary ovarian failure: Secondary | ICD-10-CM | POA: Insufficient documentation

## 2018-02-04 ENCOUNTER — Ambulatory Visit (INDEPENDENT_AMBULATORY_CARE_PROVIDER_SITE_OTHER): Payer: No Typology Code available for payment source | Admitting: Family Medicine

## 2018-02-04 ENCOUNTER — Other Ambulatory Visit: Payer: Self-pay

## 2018-02-04 ENCOUNTER — Encounter: Payer: Self-pay | Admitting: Family Medicine

## 2018-02-04 VITALS — BP 120/72 | HR 62 | Temp 98.4°F | Resp 12 | Ht 65.5 in | Wt 154.0 lb

## 2018-02-04 DIAGNOSIS — J310 Chronic rhinitis: Secondary | ICD-10-CM

## 2018-02-04 DIAGNOSIS — R442 Other hallucinations: Secondary | ICD-10-CM

## 2018-02-04 MED ORDER — FLUTICASONE PROPIONATE 50 MCG/ACT NA SUSP: 2.0000 | Freq: Every day | NASAL | 6 refills | Status: DC

## 2018-02-04 MED ORDER — CETIRIZINE HCL 10 MG PO TABS: 10.0000 mg | ORAL_TABLET | Freq: Every day | ORAL | Status: DC

## 2018-02-04 NOTE — Progress Notes (Signed)
Patient ID: Heather Powell, female    DOB: Oct 07, 1975, 42 y.o.   MRN: [id number]  PCP: Delsa Grana, PA-C  Chief Complaint  Patient presents with  . Disturbances of Smell    x1 week- states that Heather Powell has been smelling smoke when there is no reason to be smelling smoke    Subjective:   Heather Powell is a 42 y.o. female, presents to clinic with CC of disturbances of smell.  For 1 week Heather Powell has been smelling smoke about 75% the time and this has occurred at work and at home.  He also has been smelling smoke.  Heather Powell otherwise has normal smell.  Heather Powell denies any congestion, nasal discharge, sneezing, sinus pain or pressure, facial pain, fever.  Heather Powell denies any history of nasal procedures or seasonal allergies.  Heather Powell did have a mild concussion many years ago and wonders that this is related.  Heather Powell has a history of headaches which did begin after trying to discontinue Lexapro.  Gust is at Heather Powell last appointment, Heather Powell currently is only having an occasional headache once every other week and it is relieved by taking a dose of Lexapro.  Otherwise has no other symptoms including no visual disturbances, hearing disturbances, facial droop, slurred speech, numbness, tingling, weakness, neck pain or stiffness, nausea, vomiting, other focal weakness or numbness.   Patient Active Problem List   Diagnosis Date Noted  . Female hypogonadism 12/30/2017  . Vitamin D deficiency 12/30/2017  . Depression 12/28/2017  . Hyperlipidemia 12/08/2016  . Abnormal liver enzymes 12/08/2016  . Cesarean delivery delivered 11/29/2013     Prior to Admission medications   Medication Sig Start Date End Date Taking? Authorizing Provider  CANNABIDIOL PO Take 500 mg by mouth. 10 drops in the morning, 10 drops night   Yes [provider]  escitalopram (LEXAPRO) 20 MG tablet Take 10 mg by mouth daily. Patient taking 1/2 tablet every 7- 10 days   Yes [provider]  levonorgestrel (MIRENA) 20 MCG/24HR IUD 1 each by  Intrauterine route once.   Yes [provider]     Allergies  Allergen Reactions  . Codeine Nausea And Vomiting  . Hydromorphone Nausea And Vomiting     Family History  Problem Relation Age of Onset  . Hyperlipidemia Mother   . Stroke Father   . Heart disease Father   . Hypertension Father   . Hypertension Brother      Social History   Socioeconomic History  . Marital status: Married    Spouse name: Not on file  . Number of children: Not on file  . Years of education: Not on file  . Highest education level: Not on file  Occupational History  . Not on file  Social Needs  . Financial resource strain: Not on file  . Food insecurity:    Worry: Not on file    Inability: Not on file  . Transportation needs:    Medical: Not on file    Non-medical: Not on file  Tobacco Use  . Smoking status: Never Smoker  . Smokeless tobacco: Never Used  Substance and Sexual Activity  . Alcohol use: No    Frequency: Never  . Drug use: No  . Sexual activity: Yes  Lifestyle  . Physical activity:    Days per week: 3 days    Minutes per session: 60 min  . Stress: Not on file  Relationships  . Social connections:    Talks on phone: Not  on file    Gets together: Not on file    Attends religious service: Not on file    Active member of club or organization: Not on file    Attends meetings of clubs or organizations: Not on file    Relationship status: Not on file  . Intimate partner violence:    Fear of current or ex partner: Not on file    Emotionally abused: Not on file    Physically abused: Not on file    Forced sexual activity: Not on file  Other Topics Concern  . Not on file  Social History Narrative  . Not on file     Review of Systems  Constitutional: Negative.  Negative for activity change, appetite change, chills, diaphoresis, fatigue and fever.  HENT: Negative.  Negative for congestion, dental problem, drooling, ear discharge, ear pain, facial swelling,  hearing loss, mouth sores, nosebleeds, postnasal drip, rhinorrhea, sinus pressure, sinus pain, sneezing, sore throat, tinnitus, trouble swallowing and voice change.   Eyes: Negative.   Respiratory: Negative.   Cardiovascular: Negative.   Gastrointestinal: Negative.   Endocrine: Negative.   Genitourinary: Negative.   Musculoskeletal: Negative.   Skin: Negative.   Allergic/Immunologic: Negative.   Neurological: Negative.  Negative for dizziness, tremors, seizures, syncope, facial asymmetry, speech difficulty, weakness, light-headedness and numbness.  Hematological: Negative.  Negative for adenopathy. Does not bruise/bleed easily.  Psychiatric/Behavioral: Negative.  Negative for suicidal ideas.  All other systems reviewed and are negative.      Objective:    Vitals:   02/04/18 1406  BP: 120/72  Pulse: 62  Resp: 12  Temp: 98.4 F (36.9 C)  TempSrc: Oral  SpO2: 99%  Weight: 154 lb (69.9 kg)  Height: 5' 5.5" (1.664 m)      Physical Exam  Constitutional: Heather Powell is oriented to person, place, and time. Heather Powell appears well-developed and well-nourished. No distress.  HENT:  Head: Normocephalic and atraumatic.  Right Ear: Hearing, tympanic membrane, external ear and ear canal normal.  Left Ear: Hearing, tympanic membrane, external ear and ear canal normal.  Nose: Mucosal edema and rhinorrhea present. No nose lacerations, sinus tenderness, nasal deformity, septal deviation or nasal septal hematoma. No epistaxis.  No foreign bodies. Right sinus exhibits no maxillary sinus tenderness and no frontal sinus tenderness. Left sinus exhibits no maxillary sinus tenderness and no frontal sinus tenderness.  Mouth/Throat: Uvula is midline, oropharynx is clear and moist and mucous membranes are normal. Mucous membranes are not pale, not dry and not cyanotic. Heather Powell does not have dentures. No oral lesions. No trismus in the jaw. Normal dentition. No uvula swelling. No oropharyngeal exudate, posterior  oropharyngeal edema or posterior oropharyngeal erythema.  Very edematous nasal turbinates bilaterally with erythema and moderate clear mucus discharge bilaterally The nasal mucosa is edematous and erythematous  Eyes: Pupils are equal, round, and reactive to light. Conjunctivae, EOM and lids are normal. Lids are everted and swept, no foreign bodies found. Right eye exhibits no chemosis, no discharge and no exudate. Left eye exhibits no chemosis, no discharge and no exudate. Right conjunctiva is not injected. Right conjunctiva has no hemorrhage. Left conjunctiva is not injected. Left conjunctiva has no hemorrhage. Right eye exhibits normal extraocular motion and no nystagmus. Left eye exhibits normal extraocular motion and no nystagmus. Right pupil is round and reactive. Left pupil is round and reactive. Pupils are equal.  Neck: No tracheal deviation present.  Cardiovascular: Normal rate and regular rhythm.  Pulmonary/Chest: Effort normal. No stridor. No respiratory distress.  Musculoskeletal: Normal range of motion.  Neurological: Heather Powell is alert and oriented to person, place, and time. Heather Powell has normal strength. No cranial nerve deficit. Heather Powell exhibits normal muscle tone. Coordination and gait normal.  MENTAL STATUS: AAOx3, memory intact, fund of knowledge appropriate  LANG/SPEECH: Naming and repetition intact, fluent, follows 3-step commands  CRANIAL NERVES:   II: Pupils equal and reactive, no RAPD, no VF deficits   III, IV, VI: EOM intact, no gaze preference or deviation, no nystagmus.   V: normal sensation in V1, V2, and V3 segments bilaterally   VII: no asymmetry, no nasolabial fold flattening   VIII: normal hearing to speech   IX, X: normal palatal elevation, no uvular deviation   XI: 5/5 head turn and 5/5 shoulder shrug bilaterally   XII: midline tongue protrusion    Skin: Skin is warm and dry. No rash noted. Heather Powell is not diaphoretic.  Psychiatric: Heather Powell has a normal mood and affect. Heather Powell behavior  is normal.  Nursing note and vitals reviewed.         Assessment & Plan:      ICD-10-CM   1. Phantosmia R44.2 fluticasone (FLONASE) 50 MCG/ACT nasal spray    cetirizine (ZYRTEC ALLERGY) 10 MG tablet  2. Rhinitis, unspecified type J31.0 fluticasone (FLONASE) 50 MCG/ACT nasal spray    cetirizine (ZYRTEC ALLERGY) 10 MG tablet   suspect baseline allergies and viral illness with erythema and discharge    Patient with 1 week of abnormal smells, denies any other sinus or constitutional symptoms.  Clear etiology if phantosmia.  No other concerning neurological history, no recent head injuries, neurological evaluation grossly normal, cranial nerves II through XII normal.   HEENT exam is significant for rhinitis which I suspect is acute on chronic with baseline allergies and URI/viral illness.    Plan to treat for 1 to 2 weeks with antihistamines and steroid nasal sprays.  If no improvement to return, and with any worsening would have a lower threshold to treat rhinitis with antibiotics as there be some possibility of abnormal smells coming from a bacterial infection.  Did discuss with the patient the option to later refer to ENT if Heather Powell continues to have sinus findings on physical exam and unresolved smelling issues.  Currently do not feel warrants any neurological referral.  Mild headache syndromes which occurs once or twice a month, no recent head injury and has some vague and remote concussion which I do not feel could be related to an acute smelling issue.   Delsa Grana, PA-C 02/04/18 2:30 PM

## 2018-04-12 MED FILL — buPROPion HCL ER (XL) 300 M: 300 | 90 days supply | Qty: 90 | Fill #0

## 2018-10-14 MED FILL — buPROPion HCL ER (XL) 300 M: 300 | 90 days supply | Qty: 90 | Fill #0

## 2018-11-11 LAB — HM PAP SMEAR: HM Pap smear: ABNORMAL

## 2018-11-11 LAB — HM MAMMOGRAPHY: HM Mammogram: NORMAL (ref 0–4)

## 2018-11-11 MED FILL — FLUoxetine HCL 20 MG CAPS: 20 | 90 days supply | Qty: 90 | Fill #0

## 2019-01-19 MED FILL — buPROPion HCL ER (XL) 300 M: 300 | 90 days supply | Qty: 90 | Fill #0

## 2019-02-22 MED FILL — FLUoxetine HCL 20 MG CAPS: 20 | 90 days supply | Qty: 90 | Fill #1

## 2019-03-01 ENCOUNTER — Other Ambulatory Visit: Payer: Self-pay

## 2019-03-01 ENCOUNTER — Encounter: Payer: Self-pay | Admitting: Neurology

## 2019-03-01 ENCOUNTER — Encounter: Payer: Self-pay | Admitting: Family Medicine

## 2019-03-01 ENCOUNTER — Ambulatory Visit (INDEPENDENT_AMBULATORY_CARE_PROVIDER_SITE_OTHER): Payer: No Typology Code available for payment source | Admitting: Family Medicine

## 2019-03-01 ENCOUNTER — Ambulatory Visit: Payer: No Typology Code available for payment source | Admitting: Neurology

## 2019-03-01 VITALS — BP 114/79 | HR 84 | Temp 97.7°F | Ht 65.0 in | Wt 152.0 lb

## 2019-03-01 VITALS — BP 119/72 | HR 82 | Temp 97.8°F | Ht 63.0 in | Wt 152.0 lb

## 2019-03-01 DIAGNOSIS — G5601 Carpal tunnel syndrome, right upper limb: Secondary | ICD-10-CM

## 2019-03-01 DIAGNOSIS — G5602 Carpal tunnel syndrome, left upper limb: Secondary | ICD-10-CM

## 2019-03-01 DIAGNOSIS — R251 Tremor, unspecified: Secondary | ICD-10-CM

## 2019-03-01 DIAGNOSIS — R350 Frequency of micturition: Secondary | ICD-10-CM

## 2019-03-01 LAB — POCT URINALYSIS DIP (CLINITEK)
Bilirubin, UA: NEGATIVE
Glucose, UA: NEGATIVE mg/dL
Ketones, POC UA: NEGATIVE mg/dL
Leukocytes, UA: NEGATIVE
Nitrite, UA: NEGATIVE
POC PROTEIN,UA: NEGATIVE
Spec Grav, UA: 1.025 (ref 1.010–1.025)
Urobilinogen, UA: 0.2 E.U./dL
pH, UA: 7 (ref 5.0–8.0)

## 2019-03-01 NOTE — Patient Instructions (Addendum)
You have a rather mild and intermittent tremor of both hands, right more than left. Given your family history of hand tremors you may have a mild form of essential tremor. I do not see any signs or symptoms of parkinson's like disease or what we call parkinsonism.   Please get your blood work done as ordered by your primary care physician today, she will be checking your thyroid screening test called TSH as well.  We do not have to add any blood work today.  For your tremor, I would not recommend any new medication For symptomatic control as your tremor is rather mild.  We can monitor your symptoms and you can make a follow-up appointment if needed.  Please remember, that any kind of tremor may be exacerbated by anxiety, anger, nervousness, excitement, dehydration, sleep deprivation, by caffeine, and low blood sugar values or blood sugar fluctuations. Some medications can exacerbate tremors, this includes SSRI type antidepressant medications.  You also have symptoms of carpal tunnel syndrome. We will do an EMG and nerve conduction velocity test, which is an electrical nerve and muscle test, which we will schedule. We will call you with the results.

## 2019-03-01 NOTE — Patient Instructions (Addendum)
labwork Urinalysis Neurology referral Wear your splint

## 2019-03-01 NOTE — Progress Notes (Addendum)
New Patient Office Visit  Subjective:  Patient ID: Heather Powell, female    DOB: 17-Apr-1976  Age: 43 y.o. MRN: [id number]  CC:  Chief Complaint  Patient presents with  . Establish Care  . Hand Pain    rt worse vs left numbness    HPI Heather Powell presents for bilat hand numbness long term -tremors new onset.  Pt states tremors in the afternoon noted. Onset in the afternoon-none in the morning.  Pt states she drinks caffeine during the day.  Tremors noted on the computer using a mouse.First noted over the  last six months. Pt played guitar in the past. Pt states she works in the yard and garden.  Pt with past h/o carpal tunnel  Past Medical History:  Diagnosis Date  . Abnormal Pap smear   . Anxiety   . Depression     Past Surgical History:  Procedure Laterality Date  . BREAST LUMPECTOMY     [place] 2009 benign breast lump  . CESAREAN SECTION  04/13/2012   Procedure: CESAREAN SECTION;  Surgeon: Cyril Mourning, MD;  Location: [facility];  Service: Obstetrics;  Laterality: N/A;  . CESAREAN SECTION N/A 11/29/2013   Procedure: CESAREAN SECTION;  Surgeon: Luz Lex, MD;  Location: [facility];  Service: Obstetrics;  Laterality: N/A;  Repeat edc 12/01/13    Family History  Problem Relation Age of Onset  . Hyperlipidemia Mother   . Stroke Father   . Heart disease Father   . Hypertension Father   . Hypertension Brother     Social History   Socioeconomic History  . Marital status: Married    Spouse name: Not on file  . Number of children: Not on file  . Years of education: Not on file  . Highest education level: Not on file  Occupational History  . Occupation: Freight forwarder  Social Needs  . Financial resource strain: Not on file  . Food insecurity    Worry: Not on file    Inability: Not on file  . Transportation needs    Medical: Not on file    Non-medical: Not on file  Tobacco Use  . Smoking status: Never Smoker  . Smokeless tobacco: Never Used  Substance and Sexual  Activity  . Alcohol use: Yes    Frequency: Never    Comment: occasional  . Drug use: No  . Sexual activity: Yes  Lifestyle  . Physical activity    Days per week: 3 days    Minutes per session: 60 min  . Stress: Not on file  Relationships  . Social Herbalist on phone: Not on file    Gets together: Not on file    Attends religious service: Not on file    Active member of club or organization: Not on file    Attends meetings of clubs or organizations: Not on file    Relationship status: Not on file  . Intimate partner violence    Fear of current or ex partner: Not on file    Emotionally abused: Not on file    Physically abused: Not on file    Forced sexual activity: Not on file  Other Topics Concern  . Not on file  Social History Narrative  . Not on file    ROS Review of Systems  Constitutional: Negative.   HENT: Negative.   Eyes: Positive for photophobia.       Glasses-utd  Respiratory: Negative.   Cardiovascular: Negative.  Gastrointestinal: Negative.   Endocrine: Negative.   Genitourinary: Positive for frequency.       Pap abnormal-follow up in Dec 2020  Musculoskeletal: Positive for neck stiffness.       Bilat carpal tunnel bilat hip pain  Skin: Negative.   Allergic/Immunologic: Negative.   Neurological: Positive for tremors, numbness and headaches.  Hematological: Negative.   Psychiatric/Behavioral: Positive for agitation. The patient is nervous/anxious.        Depression-recent changed to Prozac/Wellbutrin    Objective:   Today's Vitals: BP 114/79 (BP Location: Left Arm, Patient Position: Sitting, Cuff Size: Normal)   Pulse 84   Temp 97.7 F (36.5 C) (Oral)   Ht 5\' 5"  (1.651 m)   Wt 152 lb (68.9 kg)   SpO2 100%   BMI 25.29 kg/m   Physical Exam Constitutional:      Appearance: Normal appearance. She is normal weight.  HENT:     Head: Normocephalic and atraumatic.     Right Ear: Tympanic membrane, ear canal and external ear normal.      Left Ear: Tympanic membrane, ear canal and external ear normal.     Nose: Nose normal.     Mouth/Throat:     Pharynx: No oropharyngeal exudate.  Eyes:     Conjunctiva/sclera: Conjunctivae normal.  Neck:     Musculoskeletal: Normal range of motion and neck supple.  Cardiovascular:     Rate and Rhythm: Normal rate and regular rhythm.     Pulses: Normal pulses.     Heart sounds: Normal heart sounds.  Pulmonary:     Effort: Pulmonary effort is normal.     Breath sounds: Normal breath sounds.  Musculoskeletal: Normal range of motion.  Neurological:     General: No focal deficit present.     Mental Status: She is alert and oriented to person, place, and time.     Cranial Nerves: No cranial nerve deficit.     Sensory: No sensory deficit.     Motor: No weakness.     Coordination: Coordination normal.     Gait: Gait normal.     Deep Tendon Reflexes: Reflexes normal.  Psychiatric:        Mood and Affect: Mood normal.        Behavior: Behavior normal.     Assessment & Plan:    Outpatient Encounter Medications as of 03/01/2019  Medication Sig  . buPROPion (WELLBUTRIN XL) 300 MG 24 hr tablet Take 300 mg by mouth daily.  Marland Kitchen FLUoxetine (PROZAC) 20 MG tablet Take 20 mg by mouth daily.  Marland Kitchen levonorgestrel (MIRENA) 20 MCG/24HR IUD 1 each by Intrauterine route once.  . [DISCONTINUED] CANNABIDIOL PO Take 500 mg by mouth. 10 drops in the morning, 10 drops night  . [DISCONTINUED] cetirizine (ZYRTEC ALLERGY) 10 MG tablet Take 1 tablet (10 mg total) by mouth daily. (Patient not taking: Reported on 03/01/2019)  . [DISCONTINUED] escitalopram (LEXAPRO) 20 MG tablet Take 10 mg by mouth daily. Patient taking 1/2 tablet every 7- 10 days  . [DISCONTINUED] fluticasone (FLONASE) 50 MCG/ACT nasal spray Place 2 sprays into both nostrils daily. (Patient not taking: Reported on 03/01/2019)   No facility-administered encounter medications on file as of 03/01/2019.   1. Tremor of right hand - CBC with  Differential - Comprehensive Metabolic Panel (CMET) - TSH - POCT Microscopic Urinalysis ([facility]) - Ambulatory referral to Neurology No visual changes, no gait disturbance Carpal tunnel splints 2. Urine frequency - POCT URINALYSIS DIP (CLINITEK)  Follow-up:  neurology LISA  Hannah Beat, MD

## 2019-03-01 NOTE — Progress Notes (Signed)
Subjective:    Patient ID: Heather Powell is a 43 y.o. female.  HPI     Star Age, MD, PhD [facility] [address] [place],  [address]  Dear Dr. Holly Bodily, I saw your patient, Heather Powell, upon your kind request in my neurologic clinic today for initial consultation of her hand tremors.  The patient is unaccompanied today.  As you know, [name] is a 43 year old right-handed woman with an underlying medical history of anxiety, depression, elevated liver enzymes, hyperlipidemia, who reports a 6+ month history of intermittent hand tremors, right more than left.  I reviewed your office note from today.  She was supposed to have blood work done but did not have a chance to do it, she is planning to do it at her workplace tomorrow. She has at times felt like her head was shaking in the inside.  She reports a family history of tremors in her maternal grandmother who was 7 years old and has hand tremors.  She has no family history of Parkinson's disease or tremors in other family members as she recalls.  Her brother has no tremors.  She has a longstanding history of carpal tunnel symptoms.  This is affected her right wrist more than left.  In the past, she tried using a splint which was somewhat helpful.  She does have issues with numbness affecting her fingers in the right hand.  Left-sided carpal tunnel symptoms are much less intense.  She has not had any problems doing her work.  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  She is a non-smoker and drinks alcohol very occasionally, she drinks caffeine and limitation, 1 soda per day.  She has had issues with tendinitis in the past.  She lives with her husband and 2 children.  Her husband has noted her hand tremor but not head tremor she reports.Of note, she has been on Prozac for the past 2 months or so.  Prior to that she was on Lexapro. Her Past Medical History Is  Significant For: Past Medical History:  Diagnosis Date  . Abnormal Pap smear   . Anxiety   . Depression     Her Past Surgical History Is Significant For: Past Surgical History:  Procedure Laterality Date  . BREAST LUMPECTOMY     [place] 2009 benign breast lump  . CESAREAN SECTION  04/13/2012   Procedure: CESAREAN SECTION;  Surgeon: Cyril Mourning, MD;  Location: [facility];  Service: Obstetrics;  Laterality: N/A;  . CESAREAN SECTION N/A 11/29/2013   Procedure: CESAREAN SECTION;  Surgeon: Luz Lex, MD;  Location: [facility];  Service: Obstetrics;  Laterality: N/A;  Repeat edc 12/01/13    Her Family History Is Significant For: Family History  Problem Relation Age of Onset  . Hyperlipidemia Mother   . Stroke Father   . Heart disease Father   . Hypertension Father   . Hypertension Brother     Her Social History Is Significant For: Social History   Socioeconomic History  . Marital status: Married    Spouse name: Not on file  . Number of children: Not on file  . Years of education: Not on file  . Highest education level: Not on file  Occupational History  . Occupation: Freight forwarder  Social Needs  . Financial resource strain: Not on file  . Food insecurity    Worry: Not on file    Inability: Not on file  .  Transportation needs    Medical: Not on file    Non-medical: Not on file  Tobacco Use  . Smoking status: Never Smoker  . Smokeless tobacco: Never Used  Substance and Sexual Activity  . Alcohol use: Yes    Frequency: Never    Comment: occasional  . Drug use: No  . Sexual activity: Yes  Lifestyle  . Physical activity    Days per week: 3 days    Minutes per session: 60 min  . Stress: Not on file  Relationships  . Social Herbalist on phone: Not on file    Gets together: Not on file    Attends religious service: Not on file    Active member of club or organization: Not on file    Attends meetings of clubs or organizations: Not on file    Relationship status:  Not on file  Other Topics Concern  . Not on file  Social History Narrative  . Not on file    Her Allergies Are:  Allergies  Allergen Reactions  . Codeine Nausea And Vomiting  . Hydromorphone Nausea And Vomiting  :   Her Current Medications Are:  Outpatient Encounter Medications as of 03/01/2019  Medication Sig  . buPROPion (WELLBUTRIN XL) 300 MG 24 hr tablet Take 300 mg by mouth daily.  Marland Kitchen FLUoxetine (PROZAC) 20 MG tablet Take 20 mg by mouth daily.  Marland Kitchen levonorgestrel (MIRENA) 20 MCG/24HR IUD 1 each by Intrauterine route once.  . Multiple Vitamins-Minerals (MULTI ADULT GUMMIES PO) Take by mouth.  . [DISCONTINUED] buPROPion (WELLBUTRIN XL) 300 MG 24 hr tablet bupropion HCl XL 300 mg 24 hr tablet, extended release  Take 1 tablet every day by oral route as directed for 90 days.  . [DISCONTINUED] FLUoxetine (PROZAC) 20 MG tablet fluoxetine 20 mg capsule  Take 1 capsule every day by oral route.   No facility-administered encounter medications on file as of 03/01/2019.   :   Review of Systems:  Out of a complete 14 point review of systems, all are reviewed and negative with the exception of these symptoms as listed below:  Review of Systems  Constitutional: Negative.   HENT: Negative.   Eyes: Negative.   Respiratory: Negative.   Cardiovascular: Negative.   Gastrointestinal: Negative.   Endocrine: Negative.   Genitourinary: Negative.   Musculoskeletal: Negative.   Skin: Negative.   Allergic/Immunologic: Negative.   Neurological: Positive for tremors.       Right hand  Hematological: Negative.   Psychiatric/Behavioral: Negative.     Objective:  Neurological Exam  Physical Exam Physical Examination:   Vitals:   03/01/19 1447  BP: 119/72  Pulse: 82  Temp: 97.8 F (36.6 C)    General Examination: The patient is a very pleasant 43 y.o. female in no acute distress. She appears well-developed and well-nourished and well groomed.   HEENT: Normocephalic, atraumatic,  pupils are equal, round and reactive to light.Corrective eyeglasses in place. Extraocular tracking is good without limitation to gaze excursion or nystagmus noted. Normal smooth pursuit is noted. Hearing is grossly intact. Face is symmetric with normal facial animation and normal facial sensation. Speech is clear with no dysarthria noted. There is no hypophonia. There is no lip, neck/head, jaw or voice tremor. Neck is supple with full range of passive and active motion. There are no carotid bruits on auscultation. Oropharynx exam reveals: mild mouth dryness, good dental hygiene.  Tongue protrudes centrally and palate elevates symmetrically.   Chest: Clear to  auscultation without wheezing, rhonchi or crackles noted.  Heart: S1+S2+0, regular and normal without murmurs, rubs or gallops noted.   Abdomen: Soft, non-tender and non-distended with normal bowel sounds appreciated on auscultation.  Extremities: There is no pitting edema in the distal lower extremities bilaterally. Pedal pulses are intact.  Skin: Warm and dry without trophic changes noted.  Musculoskeletal: exam reveals no obvious joint deformities, tenderness or joint swelling or erythema.   Neurologically:  Mental status: The patient is awake, alert and oriented in all 4 spheres. Her immediate and remote memory, attention, language skills and fund of knowledge are appropriate. There is no evidence of aphasia, agnosia, apraxia or anomia. Speech is clear with normal prosody and enunciation. Thought process is linear. Mood is normal and affect is normal.  Cranial nerves II - XII are as described above under HEENT exam. In addition: shoulder shrug is normal with equal shoulder height noted. Motor exam: Normal bulk, strength and tone is noted. There is no drift, tremor or rebound. No thenar atrophy.  She has a positive Tinel's on the right. On 03/01/2019: On Archimedes spiral drawing she has no trembling with her right or left hand, handwriting  with her right hand is legible, not tremulous, not micrographic.  She has a very slight right upper extremity and and even less left upper extremity postural tremor, no significant action tremor except for a slight action tremor with her right hand, no intention tremor, no resting tremor.  Romberg is negative. Reflexes are 2+ throughout. Babinski: Toes are flexor bilaterally. Fine motor skills and coordination: intact with normal finger taps, normal hand movements, normal rapid alternating patting, normal foot taps and normal foot agility.  Cerebellar testing: No dysmetria or intention tremor on finger to nose testing. Heel to shin is unremarkable bilaterally. There is no truncal or gait ataxia.  Sensory exam: intact to light touch, pinprick, vibration, temperature sense in the upper and lower extremities.  Gait, station and balance: She stands easily. No veering to one side is noted. No leaning to one side is noted. Posture is age-appropriate and stance is narrow based. Gait shows normal stride length and normal pace. No problems turning are noted. Tandem walk is unremarkable.          Assessment and Plan:  In summary, SHAMYLA GRENNAN is a very pleasant 43 y.o.-year old female with an underlying medical history of anxiety, depression, elevated liver enzymes, hyperlipidemia, who Presents for evaluation of her hand tremors of at least 6 months duration.  She has recently noted that after she started Prozac a couple of months ago her tremors may have improved.  She does report improved anxiety with her new antidepressant medication.  She may have a very mild form of essential tremor, given her Family history of tremors in her maternal grandmother.  Nevertheless, findings are quite mild at this time and she is largely reassured.  In particular, there is no evidence of parkinsonism.  She also has a history of carpal tunnel syndrome, right more so than left for years.  She has never had any EMG and nerve  conduction testing and would be willing to proceed with testing.  She is pending some blood work including TSH and I encouraged her to get this done, I did not suggest any additional blood work from my end of things.  We will call her with her EMG and nerve conduction test result and we can monitor her tremor clinically, she can follow-up as needed with me. I  answered all her questions today and she was in agreement. Thank you very much for allowing me to participate in the care of this nice patient. If I can be of any further assistance to you please do not hesitate to call me at [phone].  Sincerely,   Star Age, MD, PhD

## 2019-03-05 DIAGNOSIS — R251 Tremor, unspecified: Secondary | ICD-10-CM | POA: Insufficient documentation

## 2019-03-05 DIAGNOSIS — R350 Frequency of micturition: Secondary | ICD-10-CM | POA: Insufficient documentation

## 2019-03-08 ENCOUNTER — Other Ambulatory Visit: Payer: Self-pay

## 2019-03-08 ENCOUNTER — Other Ambulatory Visit (HOSPITAL_COMMUNITY)
Admission: RE | Admit: 2019-03-08 | Discharge: 2019-03-08 | Disposition: A | Discharge status: Home / Self Care | Payer: No Typology Code available for payment source | Source: Ambulatory Visit | Attending: Family Medicine | Admitting: Family Medicine

## 2019-03-08 DIAGNOSIS — R251 Tremor, unspecified: Secondary | ICD-10-CM | POA: Insufficient documentation

## 2019-03-08 LAB — CBC WITH DIFFERENTIAL/PLATELET
Abs Immature Granulocytes: 0.01 10*3/uL (ref 0.00–0.07)
Basophils Absolute: 0 10*3/uL (ref 0.0–0.1)
Basophils Relative: 1 %
Eosinophils Absolute: 0.1 10*3/uL (ref 0.0–0.5)
Eosinophils Relative: 2 %
HCT: 41.9 % (ref 36.0–46.0)
Hemoglobin: 13.6 g/dL (ref 12.0–15.0)
Immature Granulocytes: 0 %
Lymphocytes Relative: 21 %
Lymphs Abs: 1.3 10*3/uL (ref 0.7–4.0)
MCH: 32.2 pg (ref 26.0–34.0)
MCHC: 32.5 g/dL (ref 30.0–36.0)
MCV: 99.1 fL (ref 80.0–100.0)
Monocytes Absolute: 0.5 10*3/uL (ref 0.1–1.0)
Monocytes Relative: 8 %
Neutro Abs: 4.2 10*3/uL (ref 1.7–7.7)
Neutrophils Relative %: 68 %
Platelets: 344 10*3/uL (ref 150–400)
RBC: 4.23 MIL/uL (ref 3.87–5.11)
RDW: 12.1 % (ref 11.5–15.5)
WBC: 6.1 10*3/uL (ref 4.0–10.5)
nRBC: 0 % (ref 0.0–0.2)

## 2019-03-08 LAB — COMPREHENSIVE METABOLIC PANEL
ALT: 16 U/L (ref 0–44)
AST: 15 U/L (ref 15–41)
Albumin: 4.2 g/dL (ref 3.5–5.0)
Alkaline Phosphatase: 42 U/L (ref 38–126)
Anion gap: 9 (ref 5–15)
BUN: 24 mg/dL — ABNORMAL HIGH (ref 6–20)
CO2: 26 mmol/L (ref 22–32)
Calcium: 9.6 mg/dL (ref 8.9–10.3)
Chloride: 102 mmol/L (ref 98–111)
Creatinine, Ser: 0.72 mg/dL (ref 0.44–1.00)
GFR calc Af Amer: >60 mL/min (ref >60)
GFR calc non Af Amer: >60 mL/min (ref >60)
Glucose, Bld: 82 mg/dL (ref 70–99)
Potassium: 4.1 mmol/L (ref 3.5–5.1)
Sodium: 137 mmol/L (ref 135–145)
Total Bilirubin: 0.6 mg/dL (ref 0.3–1.2)
Total Protein: 7.3 g/dL (ref 6.5–8.1)

## 2019-03-08 LAB — TSH: TSH: 0.954 u[IU]/mL (ref 0.350–4.500)

## 2019-03-13 ENCOUNTER — Other Ambulatory Visit: Payer: Self-pay | Admitting: Family Medicine

## 2019-03-16 ENCOUNTER — Encounter: Payer: No Typology Code available for payment source | Admitting: Diagnostic Neuroimaging

## 2019-06-30 MED FILL — FLUoxetine HCL 20 MG CAPS: 20 | 90 days supply | Qty: 90 | Fill #2

## 2019-06-30 MED FILL — BUPROPION HCL ER (XL) 300 M: 300 | 90 days supply | Qty: 90 | Fill #1

## 2019-12-27 ENCOUNTER — Other Ambulatory Visit (HOSPITAL_COMMUNITY): Payer: Self-pay | Admitting: Obstetrics and Gynecology

## 2019-12-27 MED FILL — FLUoxetine HCL 20 MG CAPS: 20 | 90 days supply | Qty: 90 | Fill #0

## 2019-12-27 MED FILL — BUPROPION HCL ER (XL) 300 M: 300 | 90 days supply | Qty: 90 | Fill #0

## 2020-03-01 ENCOUNTER — Other Ambulatory Visit (HOSPITAL_COMMUNITY): Payer: Self-pay | Admitting: Adult Health Nurse Practitioner

## 2020-03-01 MED FILL — UBRELVY 100 MG TABS: 100 | 30 days supply | Qty: 10 | Fill #0

## 2020-06-13 MED FILL — FLUoxetine HCL 20 MG CAPS: 20 | 90 days supply | Qty: 90 | Fill #1

## 2020-09-10 ENCOUNTER — Other Ambulatory Visit (HOSPITAL_COMMUNITY): Payer: Self-pay

## 2020-10-24 ENCOUNTER — Other Ambulatory Visit (HOSPITAL_COMMUNITY): Payer: Self-pay

## 2020-10-24 MED FILL — Fluoxetine HCl Cap 20 MG: ORAL | 90 days supply | Qty: 90 | Fill #0 | Status: AC

## 2020-10-29 ENCOUNTER — Other Ambulatory Visit (HOSPITAL_BASED_OUTPATIENT_CLINIC_OR_DEPARTMENT_OTHER): Payer: Self-pay

## 2020-10-29 MED ORDER — FLUTICASONE PROPIONATE 50 MCG/ACT NA SUSP
NASAL | 2 refills | Status: DC
  Filled 2020-10-29: qty 16, 30d supply, fill #0

## 2020-11-08 ENCOUNTER — Other Ambulatory Visit (HOSPITAL_BASED_OUTPATIENT_CLINIC_OR_DEPARTMENT_OTHER): Payer: Self-pay

## 2021-01-22 ENCOUNTER — Other Ambulatory Visit (HOSPITAL_COMMUNITY): Payer: Self-pay

## 2021-01-22 MED ORDER — ZOLPIDEM TARTRATE 10 MG PO TABS
10.0000 mg | ORAL_TABLET | Freq: Every evening | ORAL | 4 refills | Status: DC
  Filled 2021-01-22: qty 30, 30d supply, fill #0
  Filled 2021-04-11: qty 30, 30d supply, fill #1
  Filled 2021-05-16: qty 30, 30d supply, fill #2
  Filled 2021-06-18: qty 30, 30d supply, fill #3
  Filled 2021-07-21: qty 30, 30d supply, fill #4

## 2021-01-22 MED ORDER — FLUOXETINE HCL 20 MG PO CAPS
20.0000 mg | ORAL_CAPSULE | Freq: Every morning | ORAL | 4 refills | Status: DC
  Filled 2021-01-22: qty 90, 90d supply, fill #0
  Filled 2021-09-15: qty 90, 90d supply, fill #1

## 2021-01-23 ENCOUNTER — Other Ambulatory Visit (HOSPITAL_COMMUNITY): Payer: Self-pay

## 2021-02-27 ENCOUNTER — Other Ambulatory Visit (HOSPITAL_COMMUNITY): Payer: Self-pay

## 2021-04-11 ENCOUNTER — Other Ambulatory Visit (HOSPITAL_COMMUNITY): Payer: Self-pay

## 2021-04-15 ENCOUNTER — Other Ambulatory Visit (HOSPITAL_COMMUNITY): Payer: Self-pay

## 2021-05-16 ENCOUNTER — Other Ambulatory Visit (HOSPITAL_COMMUNITY): Payer: Self-pay

## 2021-06-18 ENCOUNTER — Other Ambulatory Visit (HOSPITAL_COMMUNITY): Payer: Self-pay

## 2021-07-21 ENCOUNTER — Other Ambulatory Visit (HOSPITAL_COMMUNITY): Payer: Self-pay

## 2021-09-15 ENCOUNTER — Other Ambulatory Visit (HOSPITAL_COMMUNITY): Payer: Self-pay

## 2021-09-15 MED ORDER — ZOLPIDEM TARTRATE 10 MG PO TABS
10.0000 mg | ORAL_TABLET | Freq: Every day | ORAL | 4 refills | Status: DC
  Filled 2021-09-15: qty 30, 30d supply, fill #0
  Filled 2021-10-15: qty 30, 30d supply, fill #1
  Filled 2021-11-11 – 2021-11-12 (×2): qty 30, 30d supply, fill #2
  Filled 2021-12-17: qty 30, 30d supply, fill #3
  Filled 2022-01-27: qty 30, 30d supply, fill #4

## 2021-09-16 ENCOUNTER — Other Ambulatory Visit (HOSPITAL_COMMUNITY): Payer: Self-pay

## 2021-10-15 ENCOUNTER — Other Ambulatory Visit (HOSPITAL_COMMUNITY): Payer: Self-pay

## 2021-10-28 ENCOUNTER — Other Ambulatory Visit (HOSPITAL_COMMUNITY): Payer: Self-pay

## 2021-10-28 MED ORDER — FLUOROURACIL 5 % EX CREA
TOPICAL_CREAM | Freq: Two times a day (BID) | CUTANEOUS | 0 refills | Status: DC
  Filled 2021-10-28: qty 40, 14d supply, fill #0

## 2021-11-11 ENCOUNTER — Other Ambulatory Visit (HOSPITAL_COMMUNITY): Payer: Self-pay

## 2021-11-12 ENCOUNTER — Other Ambulatory Visit (HOSPITAL_COMMUNITY): Payer: Self-pay

## 2021-12-17 ENCOUNTER — Other Ambulatory Visit (HOSPITAL_COMMUNITY): Payer: Self-pay

## 2022-01-27 ENCOUNTER — Other Ambulatory Visit (HOSPITAL_COMMUNITY): Payer: Self-pay

## 2022-04-07 ENCOUNTER — Other Ambulatory Visit (HOSPITAL_COMMUNITY): Payer: Self-pay

## 2022-04-07 MED ORDER — ZOLPIDEM TARTRATE 10 MG PO TABS
10.0000 mg | ORAL_TABLET | Freq: Every evening | ORAL | 4 refills | Status: DC | PRN
  Filled 2022-04-07: qty 30, 30d supply, fill #0
  Filled 2022-06-17: qty 30, 30d supply, fill #1
  Filled 2022-08-17: qty 30, 30d supply, fill #0

## 2022-06-17 ENCOUNTER — Other Ambulatory Visit (HOSPITAL_COMMUNITY): Payer: Self-pay

## 2022-06-17 ENCOUNTER — Other Ambulatory Visit: Payer: Self-pay

## 2022-07-31 ENCOUNTER — Other Ambulatory Visit (HOSPITAL_COMMUNITY): Payer: Self-pay

## 2022-08-17 ENCOUNTER — Other Ambulatory Visit (HOSPITAL_COMMUNITY): Payer: Self-pay

## 2022-08-17 ENCOUNTER — Other Ambulatory Visit: Payer: Self-pay

## 2022-08-17 MED ORDER — FLUOXETINE HCL 20 MG PO CAPS
20.0000 mg | ORAL_CAPSULE | Freq: Every morning | ORAL | 4 refills | Status: DC
  Filled 2022-08-17: qty 90, 90d supply, fill #0

## 2022-10-07 ENCOUNTER — Other Ambulatory Visit (HOSPITAL_COMMUNITY): Payer: Self-pay

## 2022-10-08 ENCOUNTER — Other Ambulatory Visit (HOSPITAL_COMMUNITY): Payer: Self-pay

## 2022-10-08 ENCOUNTER — Other Ambulatory Visit: Payer: Self-pay

## 2022-10-08 MED ORDER — ZOLPIDEM TARTRATE 10 MG PO TABS
10.0000 mg | ORAL_TABLET | Freq: Every evening | ORAL | 4 refills | Status: DC | PRN
  Filled 2022-10-08: qty 30, 30d supply, fill #0
  Filled 2022-11-13 – 2022-11-30 (×2): qty 30, 30d supply, fill #1

## 2022-11-13 ENCOUNTER — Other Ambulatory Visit: Payer: Self-pay

## 2022-11-13 ENCOUNTER — Other Ambulatory Visit (HOSPITAL_COMMUNITY): Payer: Self-pay

## 2022-11-18 ENCOUNTER — Other Ambulatory Visit (HOSPITAL_COMMUNITY): Payer: Self-pay

## 2022-11-20 ENCOUNTER — Other Ambulatory Visit: Payer: Self-pay

## 2022-11-20 ENCOUNTER — Other Ambulatory Visit (HOSPITAL_COMMUNITY): Payer: Self-pay

## 2022-11-30 ENCOUNTER — Other Ambulatory Visit: Payer: Self-pay

## 2022-11-30 ENCOUNTER — Other Ambulatory Visit (HOSPITAL_COMMUNITY): Payer: Self-pay

## 2023-01-14 ENCOUNTER — Ambulatory Visit (INDEPENDENT_AMBULATORY_CARE_PROVIDER_SITE_OTHER): Payer: 59 | Admitting: Family Medicine

## 2023-01-14 ENCOUNTER — Encounter: Payer: Self-pay | Admitting: Family Medicine

## 2023-01-14 ENCOUNTER — Other Ambulatory Visit: Payer: Self-pay

## 2023-01-14 VITALS — BP 121/78 | HR 67 | Ht 64.0 in | Wt 164.1 lb

## 2023-01-14 DIAGNOSIS — E7849 Other hyperlipidemia: Secondary | ICD-10-CM | POA: Diagnosis not present

## 2023-01-14 DIAGNOSIS — E038 Other specified hypothyroidism: Secondary | ICD-10-CM

## 2023-01-14 DIAGNOSIS — R7301 Impaired fasting glucose: Secondary | ICD-10-CM | POA: Diagnosis not present

## 2023-01-14 DIAGNOSIS — E559 Vitamin D deficiency, unspecified: Secondary | ICD-10-CM | POA: Diagnosis not present

## 2023-01-14 DIAGNOSIS — Z23 Encounter for immunization: Secondary | ICD-10-CM

## 2023-01-14 DIAGNOSIS — E612 Magnesium deficiency: Secondary | ICD-10-CM

## 2023-01-14 DIAGNOSIS — G4700 Insomnia, unspecified: Secondary | ICD-10-CM

## 2023-01-14 DIAGNOSIS — Z114 Encounter for screening for human immunodeficiency virus [HIV]: Secondary | ICD-10-CM

## 2023-01-14 DIAGNOSIS — Z1159 Encounter for screening for other viral diseases: Secondary | ICD-10-CM

## 2023-01-14 DIAGNOSIS — Z1211 Encounter for screening for malignant neoplasm of colon: Secondary | ICD-10-CM

## 2023-01-14 MED ORDER — ZOLPIDEM TARTRATE 10 MG PO TABS
10.0000 mg | ORAL_TABLET | Freq: Every evening | ORAL | 0 refills | Status: DC | PRN
Start: 2023-01-14 — End: 2023-03-15
  Filled 2023-01-14: qty 30, 30d supply, fill #0

## 2023-01-14 NOTE — Progress Notes (Signed)
New Patient Office Visit  Subjective:  Patient ID: Heather Powell, female    DOB: May 14, 1976  Age: 47 y.o. MRN: [id number]  CC:  Chief Complaint  Patient presents with   Establish Care    New patient establishing care. Would like to discuss insomnia and ambien medication.     HPI Heather Powell is a 47 y.o. female with past medical history of insomnia presents for establishing care. For the details of today's visit, please refer to the assessment and plan.     Past Medical History:  Diagnosis Date   Abnormal Pap smear    Anxiety    Depression     Past Surgical History:  Procedure Laterality Date   BREAST LUMPECTOMY     TN 2009 benign breast lump   CESAREAN SECTION  04/13/2012   Procedure: CESAREAN SECTION;  Surgeon: Jeani Hawking, MD;  Location: [facility];  Service: Obstetrics;  Laterality: N/A;   CESAREAN SECTION N/A 11/29/2013   Procedure: CESAREAN SECTION;  Surgeon: Turner Daniels, MD;  Location: [facility];  Service: Obstetrics;  Laterality: N/A;  Repeat edc 12/01/13    Family History  Problem Relation Age of Onset   Hyperlipidemia Mother    Stroke Father    Heart disease Father    Hypertension Father    Hypertension Brother     Social History   Socioeconomic History   Marital status: Married    Spouse name: Not on file   Number of children: Not on file   Years of education: Not on file   Highest education level: Not on file  Occupational History   Occupation: Production designer, theatre/television/film  Tobacco Use   Smoking status: Never   Smokeless tobacco: Never  Vaping Use   Vaping status: Never Used  Substance and Sexual Activity   Alcohol use: Yes    Comment: occasional   Drug use: No   Sexual activity: Yes  Other Topics Concern   Not on file  Social History Narrative   Not on file   Social Determinants of Health   Financial Resource Strain: Not on file  Food Insecurity: No Food Insecurity (10/29/2020)   Received from [facility]   Hunger Vital Sign    Worried About  Running Out of Food in the Last Year: Never true    Ran Out of Food in the Last Year: Never true  Transportation Needs: Not on file  Physical Activity: Sufficiently Active (03/01/2019)   Exercise Vital Sign    Days of Exercise per Week: 3 days    Minutes of Exercise per Session: 60 min  Stress: Not on file  Social Connections: Unknown (10/10/2021)   Received from [facility]   Social Network    Social Network: Not on file  Intimate Partner Violence: Unknown (09/03/2021)   Received from [facility]   HITS    Physically Hurt: Not on file    Insult or Talk Down To: Not on file    Threaten Physical Harm: Not on file    Scream or Curse: Not on file    ROS Review of Systems  Constitutional:  Negative for chills and fever.  Eyes:  Negative for visual disturbance.  Respiratory:  Negative for chest tightness and shortness of breath.   Neurological:  Negative for dizziness and headaches.    Objective:   Today's Vitals: BP 121/78   Pulse 67   Ht 5\' 4"  (1.626 m)   Wt 164 lb 1.3 oz (74.4 kg)  SpO2 98%   BMI 28.16 kg/m   Physical Exam HENT:     Head: Normocephalic.     Mouth/Throat:     Mouth: Mucous membranes are moist.  Cardiovascular:     Rate and Rhythm: Normal rate.     Heart sounds: Normal heart sounds.  Pulmonary:     Effort: Pulmonary effort is normal.     Breath sounds: Normal breath sounds.  Neurological:     Mental Status: She is alert.      Assessment & Plan:   Insomnia, unspecified type Assessment & Plan: The patient has a history of insomnia and has been on Ambien since 2022. She reports increased life stressors, primarily related to work. We reviewed nonpharmacologic strategies for managing stress. Ambien will be reinstated today, and we will discuss tapering her medication at her follow-up appointment. The patient was also educated on sleep hygiene practices.    Orders: -     Zolpidem Tartrate; Take 1 tablet (10 mg total) by mouth at bedtime as  needed for insomnia  Dispense: 30 tablet; Refill: 0  Colon cancer screening -     Cologuard  IFG (impaired fasting glucose) -     Hemoglobin A1c  Vitamin D deficiency -     VITAMIN D 25 Hydroxy (Vit-D Deficiency, Fractures)  Need for hepatitis C screening test -     Hepatitis C antibody  Encounter for screening for HIV -     HIV Antibody (routine testing w rflx)  Other specified hypothyroidism -     TSH + free T4  Other hyperlipidemia -     Lipid panel -     CMP14+EGFR -     CBC with Differential/Platelet  Magnesium deficiency -     Magnesium  Need for Tdap vaccination   Note: This chart has been completed using Engineer, civil (consulting) software, and while attempts have been made to ensure accuracy, certain words and phrases may not be transcribed as intended.    Follow-up: Return in about 4 months (around 05/16/2023).   Gilmore Laroche, FNP

## 2023-01-14 NOTE — Patient Instructions (Addendum)
I appreciate the opportunity to provide care to you today!    Follow up:  4 month   Labs: please stop by the lab during the week to get your blood drawn (CBC, CMP, TSH, Lipid profile, HgA1c, Vit D)  Nonpharmacological interventions for managing stress can be highly effective in reducing stress levels and improving overall well-being. Here are several strategies:  Mindfulness and Meditation Mindfulness Meditation: Practice mindfulness meditation to increase awareness and focus, which can help reduce stress. Guided Imagery: Use guided imagery or visualization techniques to relax and create a mental escape from stressors. Relaxation Techniques Deep Breathing: Engage in deep breathing exercises to calm the nervous system and reduce stress. Progressive Muscle Relaxation: Use progressive muscle relaxation to systematically tense and then relax different muscle groups, reducing physical tension.  Physical Activity Regular Exercise: Incorporate regular physical activity, such as walking, jogging, or yoga, to release endorphins and improve mood. Stretching and Yoga: Practice yoga or stretching exercises to enhance flexibility, relieve muscle tension, and promote relaxation. Healthy Lifestyle Balanced Diet: Eat a nutritious diet rich in fruits, vegetables, whole grains, and lean proteins to support overall health and reduce stress. Adequate Sleep: Ensure you get sufficient, quality sleep to help your body and mind recover from daily stress. Social Clinical research associate with Others: Maintain strong social connections by spending time with family and friends or participating in social activities. Seek Support: Consider joining support groups or talking to a counselor or therapist for additional emotional support. Time Management Prioritize Tasks: Organize and prioritize tasks to manage workload and reduce feelings of overwhelm. Set Realistic Goals: Set achievable goals and break tasks into smaller,  manageable steps. Hobbies and Leisure Activities Engage in [place]: Spend time on activities you enjoy, such as reading, gardening, or crafting, to divert attention from stressors and promote relaxation. Creative Outlets: Explore creative activities like painting or playing music to express emotions and reduce stress. Healthy Boundaries Set Boundaries: Learn to set limits and say no to avoid overcommitting and becoming overwhelmed. Practice Assertiveness: Communicate your needs and boundaries clearly and respectfully. Journaling Expressive Writing: Keep a journal to express thoughts and feelings, which can help process emotions and gain perspective on stressors. Relaxation Spaces Create a Relaxing Environment: Designate a quiet, comfortable space for relaxation and stress reduction activities.   Certain supplements may help promote better sleep. Here are some commonly used supplements and their benefits:  Lavender Function: Lavender is known for its calming and relaxing properties. It can help reduce anxiety and promote a sense of relaxation, which may improve sleep quality. Usage: Lavender essential oil can be used in aromatherapy. Add a few drops to a diffuser, or place a few drops on your pillow. Lavender can also be used in the form of teas or capsules.  Magnesium Function: Magnesium is a mineral that supports muscle relaxation and helps calm the nervous system. It may improve sleep quality and support overall relaxation. Usage: Typically taken as a supplement in doses of 300 mg to 400 mg daily. It is often best taken in the evening to support relaxation before bedtime.   Nonpharmacological interventions for insomnia can significantly improve sleep quality and overall well-being. Here are several strategies:  Sleep Hygiene Consistent Sleep Schedule: Go to bed and wake up at the same time every day, even on weekends, to regulate your body's internal clock. Sleep Environment: Create a  comfortable, quiet, and dark sleep environment. Use blackout curtains and white noise machines if needed. Comfortable Bedding: Ensure your mattress and pillows  are comfortable and supportive. Relaxation Techniques Deep Breathing Exercises: Practice deep breathing exercises to calm the mind and body before bedtime. Progressive Muscle Relaxation: Use progressive muscle relaxation to reduce physical tension and promote relaxation. Meditation: Engage in mindfulness meditation or guided imagery to reduce stress and prepare your mind for sleep. Limit Stimulants and Disruptors Caffeine and Nicotine: Avoid consuming caffeine and nicotine in the late afternoon and evening, as they can interfere with sleep. Alcohol: Limit alcohol intake, as it can disrupt sleep patterns. Evening Routine Relaxing Pre-Sleep Activities: Develop a calming pre-sleep routine, such as reading a book, taking a warm bath, or practicing gentle stretching. Avoid Screens: Limit exposure to screens (phones, tablets, computers) at least an hour before bedtime to reduce blue light exposure, which can interfere with melatonin production. Physical Activity Regular Exercise: Engage in regular physical activity during the day, but avoid vigorous exercise close to bedtime. Exercise can help regulate sleep patterns and improve sleep quality. Diet and Nutrition Healthy Eating Habits: Avoid heavy meals and large quantities of fluids close to bedtime. Opt for a light snack if needed. Hydration: Stay hydrated throughout the day but limit fluid intake in the evening to reduce nighttime awakenings. Stress Management Manage Stress: Implement stress-reduction techniques such as yoga, journaling, or relaxation exercises to alleviate anxiety and improve sleep. Exposure to [employer] Exposure: Get exposure to natural light during the day to help regulate your circadian rhythm and improve sleep-wake cycles. Bedtime Restriction Limit Time in  Bed: If you have trouble falling asleep, limit the amount of time spent in bed to only the time you are actually asleep, and gradually increase it as your sleep improves.  Avoid Napping Limit Naps: If necessary, keep naps short (20-30 minutes) and avoid napping late in the day to prevent interference with nighttime sleep.   Attached with your AVS, you will find valuable resources for self-education. I highly recommend dedicating some time to thoroughly examine them.    It was a pleasure to see you and I look forward to continuing to work together on your health and well-being. Please do not hesitate to call the office if you need care or have questions about your care.  In case of emergency, please visit the Emergency Department for urgent care, or contact our clinic at [phone] to schedule an appointment. We're here to help you!   Have a wonderful day and week. With Gratitude, Gilmore Laroche MSN, FNP-BC

## 2023-01-17 DIAGNOSIS — G4700 Insomnia, unspecified: Secondary | ICD-10-CM | POA: Insufficient documentation

## 2023-01-17 NOTE — Assessment & Plan Note (Signed)
The patient has a history of insomnia and has been on Ambien since 2022. She reports increased life stressors, primarily related to work. We reviewed nonpharmacologic strategies for managing stress. Ambien will be reinstated today, and we will discuss tapering her medication at her follow-up appointment. The patient was also educated on sleep hygiene practices.

## 2023-01-27 LAB — COLOGUARD

## 2023-03-11 ENCOUNTER — Telehealth: Payer: 59 | Admitting: Family Medicine

## 2023-03-11 ENCOUNTER — Encounter: Payer: Self-pay | Admitting: Family Medicine

## 2023-03-11 VITALS — Temp 102.0°F | Ht 64.0 in | Wt 158.0 lb

## 2023-03-11 DIAGNOSIS — J069 Acute upper respiratory infection, unspecified: Secondary | ICD-10-CM | POA: Insufficient documentation

## 2023-03-11 MED ORDER — PREDNISONE 20 MG PO TABS
20.0000 mg | ORAL_TABLET | Freq: Two times a day (BID) | ORAL | 0 refills | Status: AC
Start: 2023-03-11 — End: 2023-03-16

## 2023-03-11 MED ORDER — NOREL AD 4-10-325 MG PO TABS: ORAL_TABLET | ORAL | 0 refills | Status: DC

## 2023-03-11 MED ORDER — AZITHROMYCIN 250 MG PO TABS: ORAL_TABLET | ORAL | 0 refills | Status: DC

## 2023-03-11 NOTE — Progress Notes (Signed)
Virtual Visit via Video Note  I connected with Heather Powell on 03/11/23 at  8:40 AM EDT by a video enabled telemedicine application and verified that I am speaking with the correct person using two identifiers.  Patient Location: Home Provider Location: Office/Clinic  I discussed the limitations, risks, security, and privacy concerns of performing an evaluation and management service by video and the availability of in person appointments. I also discussed with the patient that there may be a patient responsible charge related to this service. The patient expressed understanding and agreed to proceed.  Subjective: PCP: Gilmore Laroche, FNP  Chief Complaint  Patient presents with   Cough    Patient complains of cough, body aches, chills, fever starting 5 days ago.    Heather Powell 47 year old female presents via telehealth. The patient reports a productive cough and a recent fever of 102.76F. Patient also describe shortness of breath with activity, fatigue, persistent fever, body aches, sinus pressure, nasal congestion, brown sputum, and sweating. These symptoms began 5 days ago and have gradually worsened. The patient denies chest pain, nausea, or vomiting. Over-the-counter pain relievers and fever reducers have provided some relief, as has an over-the-counter cough suppressant. The patient has no history of pneumonia or bronchitis.      ROS: Per HPI  Current Outpatient Medications:    azithromycin (ZITHROMAX) 250 MG tablet, Take 2 tablets on day 1, then 1 tablet daily on days 2 through 5, Disp: 6 tablet, Rfl: 0   Chlorphen-PE-Acetaminophen (NOREL AD) 4-10-325 MG TABS, Take 1 tablet every 4 hours while symptoms persists. Do not take more than 6 tablets in 24 hours., Disp: 20 tablet, Rfl: 0   FLUoxetine (PROZAC) 20 MG capsule, Take 1 capsule (20 mg total) by mouth every morning., Disp: 90 capsule, Rfl: 4   levonorgestrel (MIRENA) 20 MCG/24HR IUD, 1 each by Intrauterine route  once., Disp: , Rfl:    Multiple Vitamins-Minerals (MULTI ADULT GUMMIES PO), Take by mouth., Disp: , Rfl:    predniSONE (DELTASONE) 20 MG tablet, Take 1 tablet (20 mg total) by mouth 2 (two) times daily with a meal for 5 days., Disp: 10 tablet, Rfl: 0   zolpidem (AMBIEN) 10 MG tablet, Take 1 tablet (10 mg total) by mouth at bedtime as needed for insomnia, Disp: 30 tablet, Rfl: 0  Observations/Objective: Today's Vitals   03/11/23 0832  Temp: (!) 102 F (38.9 C)  Weight: 158 lb (71.7 kg)  Height: 5\' 4"  (1.626 m)   Physical Exam Patient is alert and no acute distress noted.   Assessment and Plan: Upper respiratory tract infection, unspecified type Assessment & Plan: Azithromycin 250 mg x 5 days Prednisone twice daily x 5 days Norel-AD PRN Discussed Symptomatic treatment, rest, increase oral fluid intake.  Follow-up for worsening or persistent symptoms.Patient verbalizes understanding regarding plan of care and all questions answered   Orders: -     Azithromycin; Take 2 tablets on day 1, then 1 tablet daily on days 2 through 5  Dispense: 6 tablet; Refill: 0 -     predniSONE; Take 1 tablet (20 mg total) by mouth 2 (two) times daily with a meal for 5 days.  Dispense: 10 tablet; Refill: 0 -     Norel AD; Take 1 tablet every 4 hours while symptoms persists. Do not take more than 6 tablets in 24 hours.  Dispense: 20 tablet; Refill: 0    Follow Up Instructions: No follow-ups on file.   I discussed the  assessment and treatment plan with the patient. The patient was provided an opportunity to ask questions, and all were answered. The patient agreed with the plan and demonstrated an understanding of the instructions.   The patient was advised to call back or seek an in-person evaluation if the symptoms worsen or if the condition fails to improve as anticipated.  The above assessment and management plan was discussed with the patient. The patient verbalized understanding of and has agreed to  the management plan.   Heather Lederer Newman Nip, FNP

## 2023-03-11 NOTE — Assessment & Plan Note (Signed)
Azithromycin 250 mg x 5 days Prednisone twice daily x 5 days Norel-AD PRN Discussed Symptomatic treatment, rest, increase oral fluid intake.  Follow-up for worsening or persistent symptoms.Patient verbalizes understanding regarding plan of care and all questions answered

## 2023-03-15 ENCOUNTER — Telehealth: Payer: 59 | Admitting: Family Medicine

## 2023-03-15 ENCOUNTER — Other Ambulatory Visit: Payer: Self-pay | Admitting: Family Medicine

## 2023-03-15 DIAGNOSIS — G4700 Insomnia, unspecified: Secondary | ICD-10-CM

## 2023-03-16 ENCOUNTER — Other Ambulatory Visit: Payer: Self-pay

## 2023-03-16 MED ORDER — ZOLPIDEM TARTRATE 10 MG PO TABS
10.0000 mg | ORAL_TABLET | Freq: Every evening | ORAL | 0 refills | Status: DC | PRN
  Filled 2023-03-16 – 2023-03-18 (×2): qty 30, 30d supply, fill #0

## 2023-03-18 ENCOUNTER — Other Ambulatory Visit: Payer: Self-pay

## 2023-03-18 ENCOUNTER — Other Ambulatory Visit (HOSPITAL_COMMUNITY): Payer: Self-pay

## 2023-04-01 DIAGNOSIS — Z6826 Body mass index (BMI) 26.0-26.9, adult: Secondary | ICD-10-CM | POA: Diagnosis not present

## 2023-04-01 DIAGNOSIS — Z01419 Encounter for gynecological examination (general) (routine) without abnormal findings: Secondary | ICD-10-CM | POA: Diagnosis not present

## 2023-04-01 DIAGNOSIS — Z124 Encounter for screening for malignant neoplasm of cervix: Secondary | ICD-10-CM | POA: Diagnosis not present

## 2023-04-01 DIAGNOSIS — Z1231 Encounter for screening mammogram for malignant neoplasm of breast: Secondary | ICD-10-CM | POA: Diagnosis not present

## 2023-04-02 ENCOUNTER — Other Ambulatory Visit (HOSPITAL_COMMUNITY): Payer: Self-pay

## 2023-04-02 MED ORDER — ZOLPIDEM TARTRATE 10 MG PO TABS
10.0000 mg | ORAL_TABLET | Freq: Every evening | ORAL | 4 refills | Status: DC
  Filled 2023-04-02 – 2023-05-04 (×4): qty 30, 30d supply, fill #0

## 2023-04-08 ENCOUNTER — Telehealth: Payer: 59 | Admitting: Family Medicine

## 2023-04-12 ENCOUNTER — Other Ambulatory Visit (HOSPITAL_COMMUNITY): Payer: Self-pay

## 2023-04-12 ENCOUNTER — Other Ambulatory Visit: Payer: Self-pay

## 2023-04-14 ENCOUNTER — Other Ambulatory Visit: Payer: Self-pay

## 2023-04-14 ENCOUNTER — Other Ambulatory Visit (HOSPITAL_COMMUNITY): Payer: Self-pay

## 2023-05-04 ENCOUNTER — Other Ambulatory Visit: Payer: Self-pay

## 2023-05-04 ENCOUNTER — Other Ambulatory Visit (HOSPITAL_COMMUNITY): Payer: Self-pay

## 2023-05-04 ENCOUNTER — Other Ambulatory Visit: Payer: Self-pay | Admitting: Family Medicine

## 2023-05-04 DIAGNOSIS — G4700 Insomnia, unspecified: Secondary | ICD-10-CM

## 2023-05-05 ENCOUNTER — Other Ambulatory Visit: Payer: Self-pay

## 2023-05-06 ENCOUNTER — Other Ambulatory Visit (HOSPITAL_BASED_OUTPATIENT_CLINIC_OR_DEPARTMENT_OTHER): Payer: Self-pay

## 2023-05-06 ENCOUNTER — Other Ambulatory Visit (HOSPITAL_COMMUNITY): Payer: Self-pay

## 2023-05-10 ENCOUNTER — Other Ambulatory Visit (HOSPITAL_COMMUNITY): Payer: Self-pay

## 2023-05-13 ENCOUNTER — Other Ambulatory Visit (HOSPITAL_COMMUNITY)
Admission: RE | Admit: 2023-05-13 | Discharge: 2023-05-13 | Disposition: A | Discharge status: Home / Self Care | Payer: 59 | Source: Ambulatory Visit | Attending: Family Medicine | Admitting: Family Medicine

## 2023-05-13 DIAGNOSIS — E7849 Other hyperlipidemia: Secondary | ICD-10-CM | POA: Insufficient documentation

## 2023-05-13 DIAGNOSIS — E038 Other specified hypothyroidism: Secondary | ICD-10-CM | POA: Diagnosis not present

## 2023-05-13 LAB — CBC WITH DIFFERENTIAL/PLATELET
Abs Immature Granulocytes: 0.01 10*3/uL (ref 0.00–0.07)
Basophils Absolute: 0 10*3/uL (ref 0.0–0.1)
Basophils Relative: 0 %
Eosinophils Absolute: 0.1 10*3/uL (ref 0.0–0.5)
Eosinophils Relative: 1 %
HCT: 39.5 % (ref 36.0–46.0)
Hemoglobin: 13 g/dL (ref 12.0–15.0)
Immature Granulocytes: 0 %
Lymphocytes Relative: 22 %
Lymphs Abs: 1.2 10*3/uL (ref 0.7–4.0)
MCH: 31.5 pg (ref 26.0–34.0)
MCHC: 32.9 g/dL (ref 30.0–36.0)
MCV: 95.6 fL (ref 80.0–100.0)
Monocytes Absolute: 0.4 10*3/uL (ref 0.1–1.0)
Monocytes Relative: 7 %
Neutro Abs: 3.9 10*3/uL (ref 1.7–7.7)
Neutrophils Relative %: 70 %
Platelets: 294 10*3/uL (ref 150–400)
RBC: 4.13 MIL/uL (ref 3.87–5.11)
RDW: 12.5 % (ref 11.5–15.5)
WBC: 5.6 10*3/uL (ref 4.0–10.5)
nRBC: 0 % (ref 0.0–0.2)

## 2023-05-13 LAB — COMPREHENSIVE METABOLIC PANEL
ALT: 16 U/L (ref 0–44)
AST: 17 U/L (ref 15–41)
Albumin: 4 g/dL (ref 3.5–5.0)
Alkaline Phosphatase: 42 U/L (ref 38–126)
Anion gap: 9 (ref 5–15)
BUN: 14 mg/dL (ref 6–20)
CO2: 21 mmol/L — ABNORMAL LOW (ref 22–32)
Calcium: 9.1 mg/dL (ref 8.9–10.3)
Chloride: 105 mmol/L (ref 98–111)
Creatinine, Ser: 0.7 mg/dL (ref 0.44–1.00)
GFR, Estimated: >60 mL/min (ref >60)
Glucose, Bld: 86 mg/dL (ref 70–99)
Potassium: 3.8 mmol/L (ref 3.5–5.1)
Sodium: 135 mmol/L (ref 135–145)
Total Bilirubin: 0.8 mg/dL (ref <1.2)
Total Protein: 6.9 g/dL (ref 6.5–8.1)

## 2023-05-13 LAB — TSH: TSH: 1.423 u[IU]/mL (ref 0.350–4.500)

## 2023-05-17 ENCOUNTER — Ambulatory Visit: Payer: 59 | Admitting: Family Medicine

## 2023-09-29 ENCOUNTER — Telehealth: Payer: Self-pay

## 2023-09-29 NOTE — Telephone Encounter (Signed)
 Copied from CRM [id number]. Topic: Clinical - Medication Question >> Sep 28, 2023  4:28 PM Elle L wrote: Reason for CRM: The patient is going on a cruise and usually gets prescription patches for motion sickness and wanted to see if she needed an appointment to have them prescribed. I advised her that I could send a message to FNP Gloria Zarwolo to see what she recommends. However, she is going to get the name of the patches and call back. >> Sep 28, 2023  4:33 PM Bridgette Campus T wrote: Scopolamine  patches- found the name of the patch- need a 14 day supply- [phone] in [place]

## 2023-09-30 ENCOUNTER — Other Ambulatory Visit: Payer: Self-pay

## 2023-09-30 ENCOUNTER — Other Ambulatory Visit: Payer: Self-pay | Admitting: Family Medicine

## 2023-09-30 DIAGNOSIS — T753XXA Motion sickness, initial encounter: Secondary | ICD-10-CM

## 2023-09-30 MED ORDER — SCOPOLAMINE 1 MG/3DAYS TD PT72: 1.0000 | MEDICATED_PATCH | TRANSDERMAL | 2 refills | Status: DC

## 2023-09-30 NOTE — Telephone Encounter (Signed)
Pt informed

## 2023-09-30 NOTE — Telephone Encounter (Signed)
 Rx sent.

## 2024-05-23 ENCOUNTER — Encounter

## 2024-05-23 ENCOUNTER — Telehealth

## 2024-05-23 DIAGNOSIS — R509 Fever, unspecified: Secondary | ICD-10-CM | POA: Diagnosis not present

## 2024-05-23 DIAGNOSIS — R6889 Other general symptoms and signs: Secondary | ICD-10-CM

## 2024-05-23 DIAGNOSIS — R52 Pain, unspecified: Secondary | ICD-10-CM | POA: Diagnosis not present

## 2024-05-23 MED ORDER — OSELTAMIVIR PHOSPHATE 75 MG PO CAPS: 75.0000 mg | ORAL_CAPSULE | Freq: Two times a day (BID) | ORAL | 0 refills | Status: AC

## 2024-05-23 NOTE — Progress Notes (Signed)
" ° °  Virtual Visit via Video Note  I connected with Heather Powell on 05/23/2024 at 11:40 AM EST by a video enabled telemedicine application and verified that I am speaking with the correct person using two identifiers.  Patient Location: Home Provider Location: Office/Clinic  I discussed the limitations, risks, security, and privacy concerns of performing an evaluation and management service by video and the availability of in person appointments. I also discussed with the patient that there may be a patient responsible charge related to this service. The patient expressed understanding and agreed to proceed.  Subjective: PCP: Edman Meade PEDLAR, FNP  Chief Complaint  Patient presents with   Medical Management of Chronic Issues    Pt states she thinks she has the flu been around people with it, symptoms started last night aching chills and low grade fever    HPI   ROS: Per HPI Current Medications[1]  Observations/Objective: There were no vitals filed for this visit. Physical Exam  Assessment and Plan: Flu-like symptoms -     Oseltamivir  Phosphate; Take 1 capsule (75 mg total) by mouth 2 (two) times daily for 5 days.  Dispense: 10 capsule; Refill: 0  Recommend OTC medication for cough relief as well as increased fluid intake.    Follow Up Instructions: No follow-ups on file.   I discussed the assessment and treatment plan with the patient. The patient was provided an opportunity to ask questions, and all were answered. The patient agreed with the plan and demonstrated an understanding of the instructions.   The patient was advised to call back or seek an in-person evaluation if the symptoms worsen or if the condition fails to improve as anticipated.  The above assessment and management plan was discussed with the patient. The patient verbalized understanding of and has agreed to the management plan.   Heather Longs, FNP    [1]  Current Outpatient Medications:     levonorgestrel (MIRENA) 20 MCG/24HR IUD, 1 each by Intrauterine route once., Disp: , Rfl:    oseltamivir  (TAMIFLU ) 75 MG capsule, Take 1 capsule (75 mg total) by mouth 2 (two) times daily for 5 days., Disp: 10 capsule, Rfl: 0  "

## 2024-06-12 ENCOUNTER — Ambulatory Visit: Admitting: Family Medicine

## 2024-06-20 ENCOUNTER — Encounter (INDEPENDENT_AMBULATORY_CARE_PROVIDER_SITE_OTHER): Payer: Self-pay | Admitting: *Deleted
